# Patient Record
Sex: Female | Born: 2000 | Race: Black or African American | Hispanic: No | Marital: Single | Smoking: Never smoker
Health system: Southern US, Community
[De-identification: ages and names within clinical notes are randomized; demographics above are authoritative.]

## PROBLEM LIST (undated history)

## (undated) DIAGNOSIS — Z789 Other specified health status: Secondary | ICD-10-CM

## (undated) DIAGNOSIS — N83209 Unspecified ovarian cyst, unspecified side: Secondary | ICD-10-CM

## (undated) HISTORY — PX: OTHER SURGICAL HISTORY: SHX169

## (undated) HISTORY — DX: Other specified health status: Z78.9

---
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
## 2021-02-12 ENCOUNTER — Encounter: Payer: Self-pay | Admitting: Certified Nurse Midwife

## 2021-02-12 ENCOUNTER — Other Ambulatory Visit: Payer: Self-pay

## 2021-02-12 ENCOUNTER — Ambulatory Visit (INDEPENDENT_AMBULATORY_CARE_PROVIDER_SITE_OTHER): Payer: Self-pay | Admitting: Certified Nurse Midwife

## 2021-02-12 VITALS — BP 104/70 | HR 96 | Resp 16 | Ht 66.0 in | Wt 103.9 lb

## 2021-02-12 DIAGNOSIS — N926 Irregular menstruation, unspecified: Secondary | ICD-10-CM

## 2021-02-12 LAB — POCT URINE PREGNANCY: Preg Test, Ur: NEGATIVE

## 2021-02-12 MED ORDER — ORTHO-NOVUM 1/35 (28) 1-35 MG-MCG PO TABS: 1.0000 | ORAL_TABLET | Freq: Every day | ORAL | 11 refills | Status: DC

## 2021-02-12 NOTE — Progress Notes (Signed)
GYN ENCOUNTER NOTE  Subjective:       Virginia Ochoa is a 20 y.o. G0P0000 female is here for gynecologic evaluation of the following issues:  1. Having irregularities with current OCP, She states she has missed some cycles. She denies missing any of the pills.     Gynecologic History Patient's last menstrual period was 11/19/2020 (exact date). Contraception: OCP (estrogen/progesterone) Last Pap: n/a  Last mammogram: n/a   Obstetric History OB History  Gravida Para Term Preterm AB Living  0 0 0 0 0 0  SAB IAB Ectopic Multiple Live Births  0 0 0 0 0    Past Medical History:  Diagnosis Date  . Known health problems: none 01/05/2019  . Ovarian cyst     Past Surgical History:  Procedure Laterality Date  . none      Current Outpatient Medications on File Prior to Visit  Medication Sig Dispense Refill  . ferrous sulfate 325 (65 FE) MG EC tablet Take 325 mg by mouth 3 (three) times daily with meals.    . vitamin C (ASCORBIC ACID) 500 MG tablet Take 500 mg by mouth daily.     No current facility-administered medications on file prior to visit.    No Known Allergies  Social History   Socioeconomic History  . Marital status: Single    Spouse name: Not on file  . Number of children: Not on file  . Years of education: Not on file  . Highest education level: Not on file  Occupational History  . Not on file  Tobacco Use  . Smoking status: Never Smoker  . Smokeless tobacco: Never Used  Vaping Use  . Vaping Use: Never used  Substance and Sexual Activity  . Alcohol use: Never  . Drug use: Never  . Sexual activity: Never    Birth control/protection: I.U.D.  Other Topics Concern  . Not on file  Social History Narrative  . Not on file   Social Determinants of Health   Financial Resource Strain: Not on file  Food Insecurity: Not on file  Transportation Needs: Not on file  Physical Activity: Not on file  Stress: Not on file  Social Connections: Not on file  Intimate  Partner Violence: Not on file    Family History  Adopted: Yes    The following portions of the patient's history were reviewed and updated as appropriate: allergies, current medications, past family history, past medical history, past social history, past surgical history and problem list.  Review of Systems Review of Systems - Negative except as mentieond in HPI Review of Systems - General ROS: negative for - chills, fatigue, fever, hot flashes, malaise or night sweats Hematological and Lymphatic ROS: negative for - bleeding problems or swollen lymph nodes Gastrointestinal ROS: negative for - abdominal pain, blood in stools, change in bowel habits and nausea/vomiting Musculoskeletal ROS: negative for - joint pain, muscle pain or muscular weakness Genito-Urinary ROS: negative for - change in menstrual cycle, dysmenorrhea, dyspareunia, dysuria, genital discharge, genital ulcers, hematuria, incontinence,heavy menses, nocturia or pelvic pain. Positive for irregular cycle on new pill   Objective:   BP 104/70   Pulse 96   Resp 16   Ht 5\' 6"  (1.676 m)   Wt 103 lb 14.4 oz (47.1 kg)   LMP 11/19/2020 (Exact Date)   BMI 16.77 kg/m  CONSTITUTIONAL: Well-developed, well-nourished female in no acute distress.  HENT:  Normocephalic, atraumatic.  NECK: Normal range of motion, supple, no masses.  Normal thyroid.  SKIN: Skin is warm and dry. No rash noted. Not diaphoretic. No erythema. No pallor. NEUROLGIC: Alert and oriented to person, place, and time. PSYCHIATRIC: Normal mood and affect. Normal behavior. Normal judgment and thought content. CARDIOVASCULAR:Not Examined RESPIRATORY: Not Examined BREASTS: Not Examined ABDOMEN: Soft, non distended; Non tender.  No Organomegaly. PELVIC:not indicated  MUSCULOSKELETAL: Normal range of motion. No tenderness.  No cyanosis, clubbing, or edema.     Assessment:   1. Irregular periods      Plan:  Discussed cycle and likely cause due to low  estrogen in current pill. Reassurance given. She is happy with taking OCP . Suggesting changing to higher dose. She verbalizes and agree to plan. Orders placed. Instructed pt to give in a few months on new pill to allow her body to adjust and her cycle to regulate. She verbalize and agree. Follow up prn.   Doreene Burke, CNM

## 2021-03-05 ENCOUNTER — Emergency Department
Admission: EM | Admit: 2021-03-05 | Discharge: 2021-03-05 | Disposition: A | Discharge status: Home / Self Care | Payer: BC Managed Care – PPO | Attending: Student in an Organized Health Care Education/Training Program | Admitting: Student in an Organized Health Care Education/Training Program

## 2021-03-05 ENCOUNTER — Encounter: Payer: Self-pay | Admitting: Emergency Medicine

## 2021-03-05 ENCOUNTER — Other Ambulatory Visit: Payer: Self-pay

## 2021-03-05 DIAGNOSIS — E860 Dehydration: Secondary | ICD-10-CM | POA: Insufficient documentation

## 2021-03-05 DIAGNOSIS — R031 Nonspecific low blood-pressure reading: Secondary | ICD-10-CM | POA: Insufficient documentation

## 2021-03-05 DIAGNOSIS — R55 Syncope and collapse: Secondary | ICD-10-CM | POA: Insufficient documentation

## 2021-03-05 LAB — BASIC METABOLIC PANEL
Anion gap: 7 (ref 5–15)
BUN: 11 mg/dL (ref 6–20)
CO2: 22 mmol/L (ref 22–32)
Calcium: 8.6 mg/dL — ABNORMAL LOW (ref 8.9–10.3)
Chloride: 106 mmol/L (ref 98–111)
Creatinine, Ser: 0.75 mg/dL (ref 0.44–1.00)
GFR, Estimated: >60 mL/min (ref >60)
Glucose, Bld: 115 mg/dL — ABNORMAL HIGH (ref 70–99)
Potassium: 3.8 mmol/L (ref 3.5–5.1)
Sodium: 135 mmol/L (ref 135–145)

## 2021-03-05 LAB — URINALYSIS, COMPLETE (UACMP) WITH MICROSCOPIC
Bilirubin Urine: NEGATIVE
Glucose, UA: NEGATIVE mg/dL
Hgb urine dipstick: NEGATIVE
Ketones, ur: 20 mg/dL — AB
Leukocytes,Ua: NEGATIVE
Nitrite: NEGATIVE
Protein, ur: NEGATIVE mg/dL
Specific Gravity, Urine: 1.023 (ref 1.005–1.030)
pH: 5 (ref 5.0–8.0)

## 2021-03-05 LAB — CBC
HCT: 36.6 % (ref 36.0–46.0)
Hemoglobin: 12.6 g/dL (ref 12.0–15.0)
MCH: 30.8 pg (ref 26.0–34.0)
MCHC: 34.4 g/dL (ref 30.0–36.0)
MCV: 89.5 fL (ref 80.0–100.0)
Platelets: 279 10*3/uL (ref 150–400)
RBC: 4.09 MIL/uL (ref 3.87–5.11)
RDW: 11.4 % — ABNORMAL LOW (ref 11.5–15.5)
WBC: 11.3 10*3/uL — ABNORMAL HIGH (ref 4.0–10.5)
nRBC: 0 % (ref 0.0–0.2)

## 2021-03-05 LAB — POC URINE PREG, ED: Preg Test, Ur: NEGATIVE

## 2021-03-05 MED ORDER — SODIUM CHLORIDE 0.9 % IV BOLUS
1000.0000 mL | Freq: Once | INTRAVENOUS | Status: AC
  Administered 2021-03-05: 1000 mL via INTRAVENOUS

## 2021-03-05 NOTE — ED Notes (Signed)
Pt attempting urine sample 

## 2021-03-05 NOTE — ED Triage Notes (Signed)
Pt comes into the ED via EMS from , states she was walking with her boyfriend and had a syncopal episode, pt is a/ox3 on arrival, denies any injuries states her boyfriend caught her and carried her back to the house   #20LFA 98/64 [id number] 115-100HR 99%RA

## 2021-03-05 NOTE — ED Triage Notes (Signed)
Took a walk outside after taking a nap and felt dizzy and fell onto gravel driveway.  Denies c/o pain.  C/o generalized fatigue.

## 2021-03-05 NOTE — ED Provider Notes (Signed)
[facility] Emergency Department Provider Note    Event Date/Time   First MD Initiated Contact with Patient 03/05/21 1620     (approximate)  I have reviewed the triage vital signs and the nursing notes.   HISTORY  Chief Complaint Loss of Consciousness    HPI Virginia Ochoa is a 20 y.o. female with no significant past medical history presents to the ER for evaluation of near syncopal event that occurred while she was outside with her boyfriend and had a near fainting spell.  No seizure-like activity.  No loss of urinary continence.  No bite her tongue.  Denies any headache.  Denies any chest pain or shortness of breath.  States that she did skip breakfast this morning.  No new medications.  Denies any abdominal pain.  No dysuria.  No diarrhea.    Past Medical History:  Diagnosis Date  . Known health problems: none 01/05/2019  . Ovarian cyst    Family History  Adopted: Yes   Past Surgical History:  Procedure Laterality Date  . none     Patient Active Problem List   Diagnosis Date Noted  . Ovarian cyst 10/19/2019  . Menorrhagia 01/05/2019      Prior to Admission medications   Medication Sig Start Date End Date Taking? Authorizing Provider  ferrous sulfate 325 (65 FE) MG EC tablet Take 325 mg by mouth 3 (three) times daily with meals.    [provider]  norethindrone-ethinyl estradiol 1/35 (ORTHO-NOVUM 1/35, 28,) tablet Take 1 tablet by mouth daily. 02/12/21   Doreene Burke, CNM  vitamin C (ASCORBIC ACID) 500 MG tablet Take 500 mg by mouth daily.    [provider]    Allergies Patient has no known allergies.    Social History Social History   Tobacco Use  . Smoking status: Never Smoker  . Smokeless tobacco: Never Used  Vaping Use  . Vaping Use: Never used  Substance Use Topics  . Alcohol use: Never  . Drug use: Never    Review of Systems Patient denies headaches, rhinorrhea, blurry vision, numbness, shortness  of breath, chest pain, edema, cough, abdominal pain, nausea, vomiting, diarrhea, dysuria, fevers, rashes or hallucinations unless otherwise stated above in HPI. ____________________________________________   PHYSICAL EXAM:  VITAL SIGNS: Vitals:   03/05/21 1505 03/05/21 1700  BP: (!) 91/56 113/71  Pulse: 89 73  Resp: 16 16  Temp: 97.8 F (36.6 C)   SpO2: 98% 99%    Constitutional: Alert and oriented.  Eyes: Conjunctivae are normal.  Head: Atraumatic. Nose: No congestion/rhinnorhea. Mouth/Throat: Mucous membranes are moist.   Neck: No stridor. Painless ROM.  Cardiovascular: Normal rate, regular rhythm. Grossly normal heart sounds.  Good peripheral circulation. Respiratory: Normal respiratory effort.  No retractions. Lungs CTAB. Gastrointestinal: Soft and nontender. No distention. No abdominal bruits. No CVA tenderness. Genitourinary:  Musculoskeletal: No lower extremity tenderness nor edema.  No joint effusions. Neurologic:  CN- intact.  No facial droop, Normal FNF.  Normal heel to shin.  Sensation intact bilaterally. Normal speech and language. No gross focal neurologic deficits are appreciated. No gait instability. Skin:  Skin is warm, dry and intact. No rash noted. Psychiatric: Mood and affect are normal. Speech and behavior are normal.  ____________________________________________   LABS (all labs ordered are listed, but only abnormal results are displayed)  Results for orders placed or performed during the hospital encounter of 03/05/21 (from the past 24 hour(s))  Basic metabolic panel     Status: Abnormal  Collection Time: 03/05/21  3:10 PM  Result Value Ref Range   Sodium 135 135 - 145 mmol/L   Potassium 3.8 3.5 - 5.1 mmol/L   Chloride 106 98 - 111 mmol/L   CO2 22 22 - 32 mmol/L   Glucose, Bld 115 (H) 70 - 99 mg/dL   BUN 11 6 - 20 mg/dL   Creatinine, Ser 1.61 0.44 - 1.00 mg/dL   Calcium 8.6 (L) 8.9 - 10.3 mg/dL   GFR, Estimated >09 >60 mL/min   Anion gap 7 5 -  15  CBC     Status: Abnormal   Collection Time: 03/05/21  3:10 PM  Result Value Ref Range   WBC 11.3 (H) 4.0 - 10.5 K/uL   RBC 4.09 3.87 - 5.11 MIL/uL   Hemoglobin 12.6 12.0 - 15.0 g/dL   HCT 45.4 09.8 - 11.9 %   MCV 89.5 80.0 - 100.0 fL   MCH 30.8 26.0 - 34.0 pg   MCHC 34.4 30.0 - 36.0 g/dL   RDW 14.7 (L) 82.9 - 56.2 %   Platelets 279 150 - 400 K/uL   nRBC 0.0 0.0 - 0.2 %  Urinalysis, Complete w Microscopic     Status: Abnormal   Collection Time: 03/05/21  3:10 PM  Result Value Ref Range   Color, Urine YELLOW (A) YELLOW   APPearance HAZY (A) CLEAR   Specific Gravity, Urine 1.023 1.005 - 1.030   pH 5.0 5.0 - 8.0   Glucose, UA NEGATIVE NEGATIVE mg/dL   Hgb urine dipstick NEGATIVE NEGATIVE   Bilirubin Urine NEGATIVE NEGATIVE   Ketones, ur 20 (A) NEGATIVE mg/dL   Protein, ur NEGATIVE NEGATIVE mg/dL   Nitrite NEGATIVE NEGATIVE   Leukocytes,Ua NEGATIVE NEGATIVE   RBC / HPF 0-5 0 - 5 RBC/hpf   WBC, UA 11-20 0 - 5 WBC/hpf   Bacteria, UA RARE (A) NONE SEEN   Squamous Epithelial / LPF 0-5 0 - 5   Mucus PRESENT   POC urine preg, ED     Status: None   Collection Time: 03/05/21  6:19 PM  Result Value Ref Range   Preg Test, Ur NEGATIVE NEGATIVE   ____________________________________________  EKG My review and personal interpretation at Time: 15:14   Indication: near syncope  Rate: 90  Rhythm: sinus Axis: normal Other: normal intervals, no stemi ____________________________________________  RADIOLOGY  I personally reviewed all radiographic images ordered to evaluate for the above acute complaints and reviewed radiology reports and findings.  These findings were personally discussed with the patient.  Please see medical record for radiology report.  ____________________________________________   PROCEDURES  Procedure(s) performed:  Procedures    Critical Care performed: no ____________________________________________   INITIAL IMPRESSION / ASSESSMENT AND PLAN / ED  COURSE  Pertinent labs & imaging results that were available during my care of the patient were reviewed by me and considered in my medical decision making (see chart for details).   DDX: Dehydration, orthostasis, heat syncope, electrolyte abnormality, anemia, seizure, dysrhythmia  Virginia Ochoa is a 20 y.o. who presents to the ED with presentation as described above.  Patient nontoxic-appearing.  Slightly low blood pressure on arrival therefore given IV fluids.  Do suspect that she is dehydrated may have had some orthostasis therefore will give IV fluids check blood work and reassess.  Her exam is reassuring.  There is no head injury.  No indication for imaging at this time.    Clinical Course as of 03/05/21 1853  Mon Mar 05, 2021  [id number] Patient feels significantly improved after IV fluids.  She is well-appearing.  Tolerating p.o. appears nonpregnant.  EKG without any signs of preexcitation syndrome.  She is able to ambulate down the hall and feels like he is back to baseline at this point he wishes stable and appropriate for outpatient follow-up.  Have discussed with the patient and available family all diagnostics and treatments performed thus far and all questions were answered to the best of my ability. The patient demonstrates understanding and agreement with plan.    [PR]    Clinical Course User Index [PR] Willy Eddy, MD    The patient was evaluated in Emergency Department today for the symptoms described in the history of present illness. He/she was evaluated in the context of the global COVID-19 pandemic, which necessitated consideration that the patient might be at risk for infection with the SARS-CoV-2 virus that causes COVID-19. Institutional protocols and algorithms that pertain to the evaluation of patients at risk for COVID-19 are in a state of rapid change based on information released by regulatory bodies including the CDC and federal and state organizations. These policies  and algorithms were followed during the patient's care in the ED.  As part of my medical decision making, I reviewed the following data within the electronic MEDICAL RECORD NUMBER Nursing notes reviewed and incorporated, Labs reviewed, notes from prior ED visits and [place] Controlled Substance Database   ____________________________________________   FINAL CLINICAL IMPRESSION(S) / ED DIAGNOSES  Final diagnoses:  Near syncope  Dehydration      NEW MEDICATIONS STARTED DURING THIS VISIT:  New Prescriptions   No medications on file     Note:  This document was prepared using Dragon voice recognition software and may include unintentional dictation errors.    Willy Eddy, MD 03/05/21 [id number]

## 2021-03-11 ENCOUNTER — Emergency Department: Payer: BC Managed Care – PPO

## 2021-03-11 ENCOUNTER — Emergency Department
Admission: EM | Admit: 2021-03-11 | Discharge: 2021-03-12 | Disposition: A | Discharge status: Home / Self Care | Payer: BC Managed Care – PPO | Attending: Emergency Medicine | Admitting: Emergency Medicine

## 2021-03-11 ENCOUNTER — Other Ambulatory Visit: Payer: Self-pay

## 2021-03-11 DIAGNOSIS — R079 Chest pain, unspecified: Secondary | ICD-10-CM | POA: Insufficient documentation

## 2021-03-11 DIAGNOSIS — R42 Dizziness and giddiness: Secondary | ICD-10-CM | POA: Insufficient documentation

## 2021-03-11 DIAGNOSIS — R0602 Shortness of breath: Secondary | ICD-10-CM

## 2021-03-11 LAB — CBC
HCT: 45.1 % (ref 36.0–46.0)
Hemoglobin: 15.7 g/dL — ABNORMAL HIGH (ref 12.0–15.0)
MCH: 31.1 pg (ref 26.0–34.0)
MCHC: 34.8 g/dL (ref 30.0–36.0)
MCV: 89.3 fL (ref 80.0–100.0)
Platelets: 326 10*3/uL (ref 150–400)
RBC: 5.05 MIL/uL (ref 3.87–5.11)
RDW: 11.4 % — ABNORMAL LOW (ref 11.5–15.5)
WBC: 5 10*3/uL (ref 4.0–10.5)
nRBC: 0 % (ref 0.0–0.2)

## 2021-03-11 LAB — BASIC METABOLIC PANEL
Anion gap: 9 (ref 5–15)
BUN: 13 mg/dL (ref 6–20)
CO2: 25 mmol/L (ref 22–32)
Calcium: 9.7 mg/dL (ref 8.9–10.3)
Chloride: 105 mmol/L (ref 98–111)
Creatinine, Ser: 0.81 mg/dL (ref 0.44–1.00)
GFR, Estimated: >60 mL/min (ref >60)
Glucose, Bld: 77 mg/dL (ref 70–99)
Potassium: 3.8 mmol/L (ref 3.5–5.1)
Sodium: 139 mmol/L (ref 135–145)

## 2021-03-11 LAB — D-DIMER, QUANTITATIVE: D-Dimer, Quant: <0.27 ug/mL-FEU (ref 0.00–0.50)

## 2021-03-11 LAB — TROPONIN I (HIGH SENSITIVITY)
Troponin I (High Sensitivity): <2 ng/L (ref <18)
Troponin I (High Sensitivity): <2 ng/L (ref <18)

## 2021-03-11 NOTE — ED Triage Notes (Signed)
Pt states she had a syncopal episode on Monday. Pt states prior to syncopal episode she became dizzy and shob. Pt states she followed up with her cardiologist and was supposed to get a monitor but has not had one yet. Pt complains of dizziness, shob, and chest pain tonight while in [employer]. Pt appears in no acute distress, able to speak in full sentences.

## 2021-03-11 NOTE — ED Provider Notes (Signed)
[facility] Emergency Department Provider Note  ____________________________________________  Time seen: Approximately 11:51 PM  I have reviewed the triage vital signs and the nursing notes.   HISTORY  Chief Complaint Shortness of Breath and Chest Pain   HPI Virginia Ochoa is a 20 y.o. female who presents for evaluation of dizziness and chest pain.  Patient reports that she was driving when she started feeling dizzy, and she developed sharp chest pain and shortness of breath.  This is patient's second episode  although the first 1 she also had a syncopal event.  The first episode happened 5 days ago.  She was seen in the emergency room and diagnosed with mild dehydration.  She has since followed up with cardiology and her primary care doctor.  She has been tested for Covid, mononucleosis, thyroid dysfunction and is on the process of getting a Holter monitor from cardiology.  She denies feeling under stress or anxiety, she denies feeling depressed.  She is accompanied by her adoptive father.  Unknown family history.  She does take birth control but denies leg pain or swelling, hemoptysis, recent travel or immobilization.  Patient denies ever having similar symptoms other than this week.  No headache, no blurry vision, no dysarthria, no diplopia, no dysphagia, no gait instability, no unilateral weakness or numbness.  According to her father who is at bedside patient seems to be more short of breath and more fatigued since having Covid several months ago.  Past Medical History:  Diagnosis Date  . Known health problems: none 01/05/2019  . Ovarian cyst     Patient Active Problem List   Diagnosis Date Noted  . Ovarian cyst 10/19/2019  . Menorrhagia 01/05/2019    Past Surgical History:  Procedure Laterality Date  . none      Prior to Admission medications   Medication Sig Start Date End Date Taking? Authorizing Provider  ferrous sulfate 325 (65 FE) MG EC tablet  Take 325 mg by mouth 3 (three) times daily with meals.    [provider]  norethindrone-ethinyl estradiol 1/35 (ORTHO-NOVUM 1/35, 28,) tablet Take 1 tablet by mouth daily. 02/12/21   Doreene Burke, CNM  vitamin C (ASCORBIC ACID) 500 MG tablet Take 500 mg by mouth daily.    [provider]    Allergies Patient has no known allergies.  Family History  Adopted: Yes    Social History Social History   Tobacco Use  . Smoking status: Never Smoker  . Smokeless tobacco: Never Used  Vaping Use  . Vaping Use: Never used  Substance Use Topics  . Alcohol use: Never  . Drug use: Never    Review of Systems  Constitutional: Negative for fever. + dizziness Eyes: Negative for visual changes. ENT: Negative for sore throat. Neck: No neck pain  Cardiovascular: + chest pain. Respiratory: + shortness of breath. Gastrointestinal: Negative for abdominal pain, vomiting or diarrhea. Genitourinary: Negative for dysuria. Musculoskeletal: Negative for back pain. Skin: Negative for rash. Neurological: Negative for headaches, weakness or numbness. Psych: No SI or HI  ____________________________________________   PHYSICAL EXAM:  VITAL SIGNS: ED Triage Vitals [03/11/21 1955]  Enc Vitals Group     BP 111/72     Pulse Rate 85     Resp 18     Temp 98 F (36.7 C)     Temp Source Oral     SpO2 100 %     Weight 104 lb (47.2 kg)     Height 5\' 6"  (  1.676 m)     Head Circumference      Peak Flow      Pain Score 8     Pain Loc      Pain Edu?      Excl. in GC?     Constitutional: Alert and oriented. Well appearing and in no apparent distress. HEENT:      Head: Normocephalic and atraumatic.         Eyes: Conjunctivae are normal. Sclera is non-icteric.       Mouth/Throat: Mucous membranes are moist.       Neck: Supple with no signs of meningismus. Cardiovascular: Regular rate and rhythm. No murmurs, gallops, or rubs. 2+ symmetrical distal pulses are present in all  extremities. No JVD. Respiratory: Normal respiratory effort. Lungs are clear to auscultation bilaterally.  Gastrointestinal: Soft, non tender, and non distended with positive bowel sounds. No rebound or guarding. Genitourinary: No CVA tenderness. Musculoskeletal:  No edema, cyanosis, or erythema of extremities. Neurologic: Normal speech and language. Face is symmetric. Moving all extremities. No gross focal neurologic deficits are appreciated. Skin: Skin is warm, dry and intact. No rash noted. Psychiatric: Mood and affect are normal. Speech and behavior are normal.  ____________________________________________   LABS (all labs ordered are listed, but only abnormal results are displayed)  Labs Reviewed  CBC - Abnormal; Notable for the following components:      Result Value   Hemoglobin 15.7 (*)    RDW 11.4 (*)    All other components within normal limits  BASIC METABOLIC PANEL  D-DIMER, QUANTITATIVE  HCG, QUANTITATIVE, PREGNANCY  POC URINE PREG, ED  TROPONIN I (HIGH SENSITIVITY)  TROPONIN I (HIGH SENSITIVITY)   ____________________________________________  EKG  ED ECG REPORT I, Nita Sickle, the attending physician, personally viewed and interpreted this ECG.  Normal sinus rhythm, rate 88, normal intervals, no STE or depression. Unchanged from prior from 5 days ago ____________________________________________  RADIOLOGY  I have personally reviewed the images performed during this visit and I agree with the Radiologist's read.   Interpretation by Radiologist:  DG Chest 2 View  Result Date: 03/11/2021 CLINICAL DATA:  Chest pain short of breath EXAM: CHEST - 2 VIEW COMPARISON:  01/24/2011 FINDINGS: The heart size and mediastinal contours are within normal limits. Both lungs are clear. Mild scoliosis. IMPRESSION: No active cardiopulmonary disease. Electronically Signed   By: Jasmine Pang M.D.   On: 03/11/2021 20:40      ____________________________________________   PROCEDURES  Procedure(s) performed:yes .1-3 Lead EKG Interpretation Performed by: Nita Sickle, MD Authorized by: Nita Sickle, MD     Interpretation: normal     ECG rate assessment: normal     Rhythm: sinus rhythm     Ectopy: none     Conduction: normal     Critical Care performed:  None ____________________________________________   INITIAL IMPRESSION / ASSESSMENT AND PLAN / ED COURSE  20 y.o. female who presents for evaluation of dizziness and chest pain.  This is patient's second visit to the emergency room for similar symptoms.  Has been evaluated here, by her PCP, and currently being evaluated by cardiology for Holter monitor.  Will place patient on telemetry for monitoring of any dysrhythmias.  Orthostatic vital signs negative.  Patient is otherwise neurologically intact, heart regular rate and rhythm with no murmurs, no abdominal tenderness, pulses are equal and strong in all 4 extremities. No HA to indicated intracranial pathology such as aneurysm or mass.  EKG with no signs of dysrhythmias or  ischemia and unchanged from prior from 5 days ago.  No signs of pericarditis. Vital signs are within normal limits.  Labs with no electrolyte derangements, no signs of dehydration, no leukocytosis.  Troponin x2 - therefore less likely myocarditis. D-dimer neg with no tachypnea, tachycardia, or hypoxia therefore less likely PE.  Pregnancy test is negative.  Patient has had thyroid studies done by PCP which were all negative.  Patient was tested for Covid by PCP which was negative per report. Old medical records reviewed.    _________________________ 12:48 AM on 03/12/2021 -----------------------------------------  Patient monitored on telemetry for 1 hour with no signs of dysrhythmias.  Remains well-appearing and in no distress.  Work-up here essentially again unremarkable.  Recommended following up with her cardiologist for the  Holter monitoring and calling her primary care doctor to discuss having another episode that brought her to the ER.  I did recommend patient try to avoid driving in case she has another episode behind the wheel.  These recommendations were discussed with her and her father who was at bedside.  Discussed my standard return precautions.    _____________________________________________ Please note:  Patient was evaluated in Emergency Department today for the symptoms described in the history of present illness. Patient was evaluated in the context of the global COVID-19 pandemic, which necessitated consideration that the patient might be at risk for infection with the SARS-CoV-2 virus that causes COVID-19. Institutional protocols and algorithms that pertain to the evaluation of patients at risk for COVID-19 are in a state of rapid change based on information released by regulatory bodies including the CDC and federal and state organizations. These policies and algorithms were followed during the patient's care in the ED.  Some ED evaluations and interventions may be delayed as a result of limited staffing during the pandemic.   [place] Controlled Substance Database was reviewed by me. ____________________________________________   FINAL CLINICAL IMPRESSION(S) / ED DIAGNOSES   Final diagnoses:  Dizziness  Chest pain, unspecified type  Shortness of breath      NEW MEDICATIONS STARTED DURING THIS VISIT:  ED Discharge Orders    None       Note:  This document was prepared using Dragon voice recognition software and may include unintentional dictation errors.    Don Perking, [place], MD 03/12/21 [phone]

## 2021-03-11 NOTE — ED Notes (Signed)
Pt placed on cardiac monitor at this time.

## 2021-03-12 LAB — HCG, QUANTITATIVE, PREGNANCY: hCG, Beta Chain, Quant, S: <1 m[IU]/mL (ref <5)

## 2021-03-12 NOTE — Discharge Instructions (Signed)
As we discussed, make sure to follow up with cardiology for the holter monitoring.  Also make sure to let your primary care doctor know that you had another episode.  In the meantime try to avoid driving in case have an episode like this why you are behind the wheel to avoid an accident.  If you are unable to avoid driving I recommend that you try at least to stay away from high-speed highways.

## 2021-03-13 ENCOUNTER — Emergency Department
Admission: EM | Admit: 2021-03-13 | Discharge: 2021-03-13 | Disposition: A | Discharge status: Home / Self Care | Payer: BC Managed Care – PPO | Attending: Emergency Medicine | Admitting: Emergency Medicine

## 2021-03-13 ENCOUNTER — Encounter: Payer: Self-pay | Admitting: Emergency Medicine

## 2021-03-13 ENCOUNTER — Emergency Department: Payer: BC Managed Care – PPO

## 2021-03-13 ENCOUNTER — Other Ambulatory Visit: Payer: Self-pay

## 2021-03-13 DIAGNOSIS — R002 Palpitations: Secondary | ICD-10-CM

## 2021-03-13 DIAGNOSIS — R079 Chest pain, unspecified: Secondary | ICD-10-CM | POA: Diagnosis not present

## 2021-03-13 DIAGNOSIS — R0602 Shortness of breath: Secondary | ICD-10-CM | POA: Diagnosis not present

## 2021-03-13 LAB — CBC
HCT: 39.5 % (ref 36.0–46.0)
Hemoglobin: 13.7 g/dL (ref 12.0–15.0)
MCH: 30.6 pg (ref 26.0–34.0)
MCHC: 34.7 g/dL (ref 30.0–36.0)
MCV: 88.4 fL (ref 80.0–100.0)
Platelets: 308 10*3/uL (ref 150–400)
RBC: 4.47 MIL/uL (ref 3.87–5.11)
RDW: 11.3 % — ABNORMAL LOW (ref 11.5–15.5)
WBC: 5 10*3/uL (ref 4.0–10.5)
nRBC: 0 % (ref 0.0–0.2)

## 2021-03-13 LAB — BASIC METABOLIC PANEL
Anion gap: 7 (ref 5–15)
BUN: 14 mg/dL (ref 6–20)
CO2: 24 mmol/L (ref 22–32)
Calcium: 9.6 mg/dL (ref 8.9–10.3)
Chloride: 105 mmol/L (ref 98–111)
Creatinine, Ser: 0.73 mg/dL (ref 0.44–1.00)
GFR, Estimated: >60 mL/min (ref >60)
Glucose, Bld: 93 mg/dL (ref 70–99)
Potassium: 3.9 mmol/L (ref 3.5–5.1)
Sodium: 136 mmol/L (ref 135–145)

## 2021-03-13 LAB — POC URINE PREG, ED: Preg Test, Ur: NEGATIVE

## 2021-03-13 LAB — TROPONIN I (HIGH SENSITIVITY): Troponin I (High Sensitivity): <2 ng/L (ref <18)

## 2021-03-13 MED ORDER — IOHEXOL 350 MG/ML SOLN
75.0000 mL | Freq: Once | INTRAVENOUS | Status: AC | PRN
  Administered 2021-03-13: 75 mL via INTRAVENOUS
  Filled 2021-03-13: qty 75

## 2021-03-13 NOTE — ED Triage Notes (Signed)
Pt c/o central chest pain intermittently over the last week. Pt has been seen in ED x2 for same and followed up with cardiology but was unable to get heart monitor in place today due to office issue. Pt to ED tonight due to continued episodes of chest pain.

## 2021-03-13 NOTE — ED Notes (Signed)
Patient transported to CT 

## 2021-03-13 NOTE — ED Provider Notes (Signed)
[facility] Emergency Department Provider Note  ____________________________________________   Event Date/Time   First MD Initiated Contact with Patient 03/13/21 1911     (approximate)  I have reviewed the triage vital signs and the nursing notes.   HISTORY  Chief Complaint Chest Pain    HPI Virginia Ochoa is a 20 y.o. female with central chest pain who comes in for evaluation.  This is patient's third time coming in for this.  Patient states that she was seen by her cardiologist and then try to get a Holter monitor to evaluate her for SVT but unfortunately they did not have a monitor today to put on her.  She states that today she had the pain prior to arrival that was constant, severe, so she with some palpitations, nothing made it better, nothing made it worse.  No abdominal pain.  The pain went away after a few minutes.  She did have some associate shortness of breath with that.  She has already had her thyroid tested negative D-dimer but was reportedly told to come to the ER to have a CT scan to rule out PE.  Her only risk factor for PE is she is on birth control  Patient is symptom-free at this time          Past Medical History:  Diagnosis Date  . Known health problems: none 01/05/2019  . Ovarian cyst     Patient Active Problem List   Diagnosis Date Noted  . Ovarian cyst 10/19/2019  . Menorrhagia 01/05/2019    Past Surgical History:  Procedure Laterality Date  . none      Prior to Admission medications   Medication Sig Start Date End Date Taking? Authorizing Provider  ferrous sulfate 325 (65 FE) MG EC tablet Take 325 mg by mouth 3 (three) times daily with meals.    [provider]  norethindrone-ethinyl estradiol 1/35 (ORTHO-NOVUM 1/35, 28,) tablet Take 1 tablet by mouth daily. 02/12/21   Doreene Burke, CNM  vitamin C (ASCORBIC ACID) 500 MG tablet Take 500 mg by mouth daily.    [provider]    Allergies Patient  has no known allergies.  Family History  Adopted: Yes    Social History Social History   Tobacco Use  . Smoking status: Never Smoker  . Smokeless tobacco: Never Used  Vaping Use  . Vaping Use: Never used  Substance Use Topics  . Alcohol use: Never  . Drug use: Never      Review of Systems Constitutional: No fever/chills Eyes: No visual changes. ENT: No sore throat. Cardiovascular: Positive chest pain, palpitation Respiratory: Denies shortness of breath. Gastrointestinal: No abdominal pain.  No nausea, no vomiting.  No diarrhea.  No constipation. Genitourinary: Negative for dysuria. Musculoskeletal: Negative for back pain. Skin: Negative for rash. Neurological: Negative for headaches, focal weakness or numbness. All other ROS negative ____________________________________________   PHYSICAL EXAM:  VITAL SIGNS: ED Triage Vitals [03/13/21 1841]  Enc Vitals Group     BP 106/69     Pulse Rate 97     Resp 15     Temp 98 F (36.7 C)     Temp Source Oral     SpO2 100 %     Weight      Height      Head Circumference      Peak Flow      Pain Score      Pain Loc      Pain Edu?  Excl. in GC?     Constitutional: Alert and oriented. Well appearing and in no acute distress. Eyes: Conjunctivae are normal. EOMI. Head: Atraumatic. Nose: No congestion/rhinnorhea. Mouth/Throat: Mucous membranes are moist.   Neck: No stridor. Trachea Midline. FROM Cardiovascular: Normal rate, regular rhythm. Grossly normal heart sounds.  Good peripheral circulation. Respiratory: Normal respiratory effort.  No retractions. Lungs CTAB. Gastrointestinal: Soft and nontender. No distention. No abdominal bruits.  Musculoskeletal: No lower extremity tenderness nor edema.  No joint effusions. Neurologic:  Normal speech and language. No gross focal neurologic deficits are appreciated.  Skin:  Skin is warm, dry and intact. No rash noted. Psychiatric: Mood and affect are normal. Speech and  behavior are normal. GU: Deferred   ____________________________________________   LABS (all labs ordered are listed, but only abnormal results are displayed)  Labs Reviewed  CBC - Abnormal; Notable for the following components:      Result Value   RDW 11.3 (*)    All other components within normal limits  BASIC METABOLIC PANEL  POC URINE PREG, ED  TROPONIN I (HIGH SENSITIVITY)   ____________________________________________   ED ECG REPORT I, Concha Se, the attending physician, personally viewed and interpreted this ECG.  Normal sinus rate of 87, no ST elevation, no T wave inversions, a lot of artifact but normal intervals grossly ____________________________________________  RADIOLOGY   Official radiology report(s): CT Angio Chest PE W and/or Wo Contrast  Result Date: 03/13/2021 CLINICAL DATA:  PE suspected, high prob Chest pain. EXAM: CT ANGIOGRAPHY CHEST WITH CONTRAST TECHNIQUE: Multidetector CT imaging of the chest was performed using the standard protocol during bolus administration of intravenous contrast. Multiplanar CT image reconstructions and MIPs were obtained to evaluate the vascular anatomy. CONTRAST:  55mL OMNIPAQUE IOHEXOL 350 MG/ML SOLN COMPARISON:  Radiograph 03/11/2021 FINDINGS: Cardiovascular: There are no filling defects within the pulmonary arteries to suggest pulmonary embolus. The thoracic aorta is normal in caliber. Conventional branching pattern from the aortic arch. Normal heart size. No pericardial effusion. Mediastinum/Nodes: No mediastinal or hilar adenopathy. Minimal residual thymus in the anterior mediastinum, normal for age. No esophageal wall thickening. No thyroid nodule. Lungs/Pleura: Clear lungs. No focal airspace disease. No pleural fluid. Trachea and central bronchi are patent. Upper Abdomen: No acute or unexpected findings. Musculoskeletal: No chest wall abnormality. No acute or suspicious osseous findings. Review of the MIP images confirms  the above findings. IMPRESSION: No pulmonary embolus or acute intrathoracic abnormality. No explanation for chest pain. Electronically Signed   By: Narda Rutherford M.D.   On: 03/13/2021 20:33    ____________________________________________   PROCEDURES  Procedure(s) performed (including Critical Care):  Procedures   ____________________________________________   INITIAL IMPRESSION / ASSESSMENT AND PLAN / ED COURSE   Virginia Ochoa was evaluated in Emergency Department on 03/13/2021 for the symptoms described in the history of present illness. She was evaluated in the context of the global COVID-19 pandemic, which necessitated consideration that the patient might be at risk for infection with the SARS-CoV-2 virus that causes COVID-19. Institutional protocols and algorithms that pertain to the evaluation of patients at risk for COVID-19 are in a state of rapid change based on information released by regulatory bodies including the CDC and federal and state organizations. These policies and algorithms were followed during the patient's care in the ED.    Most Likely DDx:  -MSK (atypical chest pain) but will get cardiac markers to evaluate for ACS given risk factors/age -Had a benefit risk discussion about CT PE given negative  D-dimer and the risk of breast cancer but family would like to proceed with CT PE to rule out pulmonary embolism -No abdominal pain to suggest this being gallbladder pathology.  Her chest pains all up higher.   DDx that was also considered d/t potential to cause harm, but was found less likely based on history and physical (as detailed above): -PNA (no fevers, cough but CXR to evaluate) -PNX (reassured with equal b/l breath sounds, CXR to evaluate) -Symptomatic anemia (will get H&H) -Aortic Dissection as no tearing pain and no radiation to the mid back, pulses equal -Pericarditis no rub on exam, EKG changes or hx to suggest dx -Tamponade (no notable SOB,  tachycardic, hypotensive) -Esophageal rupture (no h/o diffuse vomitting/no crepitus)   Labs are reassuring, CT scan is negative.  Patient already has follow-up to get echo and Holter monitor.  Patient be discharged home at this time.  Patient asymptomatic with normal vital signs  I discussed the provisional nature of ED diagnosis, the treatment so far, the ongoing plan of care, follow up appointments and return precautions with the patient and any family or support people present. They expressed understanding and agreed with the plan, discharged home.          ____________________________________________   FINAL CLINICAL IMPRESSION(S) / ED DIAGNOSES   Final diagnoses:  Palpitations  Chest pain, unspecified type     MEDICATIONS GIVEN DURING THIS VISIT:  Medications  iohexol (OMNIPAQUE) 350 MG/ML injection 75 mL (75 mLs Intravenous Contrast Given 03/13/21 2019)     ED Discharge Orders    None       Note:  This document was prepared using Dragon voice recognition software and may include unintentional dictation errors.   Concha Se, MD 03/13/21 2044

## 2021-03-13 NOTE — Discharge Instructions (Addendum)
CT is negative.  Follow up with cardiology.   Return to ER for continued symptoms or any other concerns.

## 2021-07-04 ENCOUNTER — Other Ambulatory Visit: Payer: Self-pay | Admitting: Nurse Practitioner

## 2021-07-04 DIAGNOSIS — N921 Excessive and frequent menstruation with irregular cycle: Secondary | ICD-10-CM

## 2021-07-27 ENCOUNTER — Ambulatory Visit
Admission: RE | Admit: 2021-07-27 | Discharge: 2021-07-27 | Disposition: A | Discharge status: Home / Self Care | Payer: BC Managed Care – PPO | Source: Ambulatory Visit | Attending: Nurse Practitioner | Admitting: Nurse Practitioner

## 2021-07-27 ENCOUNTER — Other Ambulatory Visit: Payer: Self-pay

## 2021-07-27 DIAGNOSIS — N921 Excessive and frequent menstruation with irregular cycle: Secondary | ICD-10-CM | POA: Diagnosis present

## 2021-09-11 ENCOUNTER — Emergency Department
Admission: EM | Admit: 2021-09-11 | Discharge: 2021-09-11 | Disposition: A | Discharge status: Home / Self Care | Payer: BC Managed Care – PPO | Attending: Emergency Medicine | Admitting: Emergency Medicine

## 2021-09-11 DIAGNOSIS — R002 Palpitations: Secondary | ICD-10-CM | POA: Insufficient documentation

## 2021-09-11 DIAGNOSIS — R000 Tachycardia, unspecified: Secondary | ICD-10-CM

## 2021-09-11 DIAGNOSIS — R072 Precordial pain: Secondary | ICD-10-CM | POA: Diagnosis not present

## 2021-09-11 LAB — CBC WITH DIFFERENTIAL/PLATELET
Abs Immature Granulocytes: 0.01 10*3/uL (ref 0.00–0.07)
Basophils Absolute: 0 10*3/uL (ref 0.0–0.1)
Basophils Relative: 0 %
Eosinophils Absolute: 0 10*3/uL (ref 0.0–0.5)
Eosinophils Relative: 0 %
HCT: 37.7 % (ref 36.0–46.0)
Hemoglobin: 13.4 g/dL (ref 12.0–15.0)
Immature Granulocytes: 0 %
Lymphocytes Relative: 27 %
Lymphs Abs: 1.4 10*3/uL (ref 0.7–4.0)
MCH: 32 pg (ref 26.0–34.0)
MCHC: 35.5 g/dL (ref 30.0–36.0)
MCV: 90 fL (ref 80.0–100.0)
Monocytes Absolute: 0.5 10*3/uL (ref 0.1–1.0)
Monocytes Relative: 9 %
Neutro Abs: 3.2 10*3/uL (ref 1.7–7.7)
Neutrophils Relative %: 64 %
Platelets: 234 10*3/uL (ref 150–400)
RBC: 4.19 MIL/uL (ref 3.87–5.11)
RDW: 11.5 % (ref 11.5–15.5)
WBC: 5.1 10*3/uL (ref 4.0–10.5)
nRBC: 0.4 % — ABNORMAL HIGH (ref 0.0–0.2)

## 2021-09-11 LAB — BASIC METABOLIC PANEL
Anion gap: 8 (ref 5–15)
BUN: 13 mg/dL (ref 6–20)
CO2: 24 mmol/L (ref 22–32)
Calcium: 9.1 mg/dL (ref 8.9–10.3)
Chloride: 105 mmol/L (ref 98–111)
Creatinine, Ser: 0.72 mg/dL (ref 0.44–1.00)
GFR, Estimated: >60 mL/min (ref >60)
Glucose, Bld: 68 mg/dL — ABNORMAL LOW (ref 70–99)
Potassium: 3.7 mmol/L (ref 3.5–5.1)
Sodium: 137 mmol/L (ref 135–145)

## 2021-09-11 LAB — TROPONIN I (HIGH SENSITIVITY): Troponin I (High Sensitivity): <2 ng/L (ref <18)

## 2021-09-11 NOTE — ED Notes (Addendum)
Pt to ED c/o throbbing, sharp pain in central chest, mainly with exhalation and at end of inspiration "like someone grabbing and pulling" in chest. Pain is worse with walking. Denies radiation to arm, jaw or neck.   Denies dizziness. Denies SOB at this time but states this morning she felt it was hard to get enough air. Also did have mild HA which resolved.  Denies NVD, diarrhea, HA.  States everything started in January-February 2022 when was walking, got dizzy and had syncopal episode. Has had multiple episodes of same and been seen at ER and has seen cardiologist and had Holter monitor at some point.  Pt in NAD, resting in bed. Mother at bedside. NSR on monitor.

## 2021-09-11 NOTE — ED Provider Notes (Signed)
[facility] Emergency Department Provider Note   ____________________________________________   Event Date/Time   First MD Initiated Contact with Patient 09/11/21 1024     (approximate)  I have reviewed the triage vital signs and the nursing notes.   HISTORY  Chief Complaint Chest Pain (CP STARTED THIS AM 0745, PT STATES SHE HAS HISTORY OF CP , )    HPI Virginia Ochoa is a 20 y.o. female who presents for chest pain  LOCATION: Central substernal chest DURATION: 2 hours prior to arrival TIMING: Mildly improved since onset SEVERITY: 8/10 QUALITY: Sharp pain CONTEXT: Patient states she intermittently gets tachycardia, palpitations, chest pain and was told to present to the cardiologist office during a symptomatic event however when they called the cardiologist they found that the provider there was supposed to see is not sick and therefore told to go to the walk-in clinic who immediately sent him to the emergency department. MODIFYING FACTORS: Denies any exacerbating or relieving factors ASSOCIATED SYMPTOMS: Palpitations, tachycardia   Per medical record review, patient has been seen multiple times for similar symptoms with negative work-ups          Past Medical History:  Diagnosis Date   Known health problems: none 01/05/2019   Ovarian cyst     Patient Active Problem List   Diagnosis Date Noted   Ovarian cyst 10/19/2019   Menorrhagia 01/05/2019    Past Surgical History:  Procedure Laterality Date   none      Prior to Admission medications   Medication Sig Start Date End Date Taking? Authorizing Provider  ferrous sulfate 325 (65 FE) MG EC tablet Take 325 mg by mouth 3 (three) times daily with meals.    [provider]  norethindrone-ethinyl estradiol 1/35 (ORTHO-NOVUM 1/35, 28,) tablet Take 1 tablet by mouth daily. 02/12/21   Doreene Burke, CNM  vitamin C (ASCORBIC ACID) 500 MG tablet Take 500 mg by mouth daily.    [provider]    Allergies Patient has no known allergies.  Family History  Adopted: Yes    Social History Social History   Tobacco Use   Smoking status: Never   Smokeless tobacco: Never  Vaping Use   Vaping Use: Never used  Substance Use Topics   Alcohol use: Never   Drug use: Never    Review of Systems Constitutional: No fever/chills Eyes: No visual changes. ENT: No sore throat. Cardiovascular: Endorses chest pain, palpitations, tachycardia Respiratory: Denies shortness of breath. Gastrointestinal: No abdominal pain.  No nausea, no vomiting.  No diarrhea. Genitourinary: Negative for dysuria. Musculoskeletal: Negative for acute arthralgias Skin: Negative for rash. Neurological: Negative for headaches, weakness/numbness/paresthesias in any extremity Psychiatric: Negative for suicidal ideation/homicidal ideation   ____________________________________________   PHYSICAL EXAM:  VITAL SIGNS: ED Triage Vitals [09/11/21 1000]  Enc Vitals Group     BP 116/63     Pulse Rate 96     Resp 17     Temp 98.5 F (36.9 C)     Temp Source Oral     SpO2 100 %     Weight 108 lb 0.4 oz (49 kg)     Height 5\' 6"  (1.676 m)     Head Circumference      Peak Flow      Pain Score 8     Pain Loc      Pain Edu?      Excl. in GC?    Constitutional: Alert and oriented. Well appearing and in no  acute distress. Eyes: Conjunctivae are normal. PERRL. Head: Atraumatic. Nose: No congestion/rhinnorhea. Mouth/Throat: Mucous membranes are moist. Neck: No stridor Cardiovascular: Grossly normal heart sounds.  Good peripheral circulation. Respiratory: Normal respiratory effort.  No retractions. Gastrointestinal: Soft and nontender. No distention. Musculoskeletal: No obvious deformities Neurologic:  Normal speech and language. No gross focal neurologic deficits are appreciated. Skin:  Skin is warm and dry. No rash noted. Psychiatric: Mood and affect are normal. Speech and behavior are  normal.  ____________________________________________   LABS (all labs ordered are listed, but only abnormal results are displayed)  Labs Reviewed  CBC WITH DIFFERENTIAL/PLATELET - Abnormal; Notable for the following components:      Result Value   nRBC 0.4 (*)    All other components within normal limits  BASIC METABOLIC PANEL - Abnormal; Notable for the following components:   Glucose, Bld 68 (*)    All other components within normal limits  TROPONIN I (HIGH SENSITIVITY)  TROPONIN I (HIGH SENSITIVITY)   ____________________________________________  EKG  ED ECG REPORT I, Merwyn Katos, the attending physician, personally viewed and interpreted this ECG.  Date: 09/11/2021 EKG Time: 1000 Rate: 103 Rhythm: Tachycardic sinus rhythm QRS Axis: normal Intervals: normal ST/T Wave abnormalities: normal Narrative Interpretation: Tachycardic sinus rhythm.  No evidence of acute ischemia  PROCEDURES  Procedure(s) performed (including Critical Care):  .1-3 Lead EKG Interpretation Performed by: Merwyn Katos, MD Authorized by: Merwyn Katos, MD     Interpretation: normal     ECG rate:  72   ECG rate assessment: normal     Rhythm: sinus rhythm     Ectopy: none     Conduction: normal     ____________________________________________   INITIAL IMPRESSION / ASSESSMENT AND PLAN / ED COURSE  As part of my medical decision making, I reviewed the following data within the electronic medical record, if available:  Nursing notes reviewed and incorporated, Labs reviewed, EKG interpreted, Old chart reviewed, Radiograph reviewed and Notes from prior ED visits reviewed and incorporated        Workup: ECG, CXR, CBC, BMP, Troponin Findings: ECG: No overt evidence of STEMI. No evidence of Brugadas sign, delta wave, epsilon wave, significantly prolonged QTc, or malignant arrhythmia HS Troponin: Negative x1 Other Labs unremarkable for emergent problems. CXR: Without PTX, PNA, or  widened mediastinum Last Stress Test: Never Last Heart Catheterization: Never HEART Score: 1  Given History, Exam, and Workup I have low suspicion for ACS, Pneumothorax, Pneumonia, Pulmonary Embolus, Tamponade, Aortic Dissection or other emergent problem as a cause for this presentation.   Reassesment: Prior to discharge patients pain was controlled and they were well appearing.  Arrange an appointment with [facility] cardiology for this patient on 09/17/2021  Disposition:  Discharge. Strict return precautions discussed with patient with full understanding. Advised patient to follow up promptly with primary care provider      ____________________________________________   FINAL CLINICAL IMPRESSION(S) / ED DIAGNOSES  Final diagnoses:  Precordial pain  Sinus tachycardia     ED Discharge Orders     None        Note:  This document was prepared using Dragon voice recognition software and may include unintentional dictation errors.    Merwyn Katos, MD 09/11/21 [phone]

## 2021-09-11 NOTE — ED Triage Notes (Signed)
PT HERE FOR CP, PT STATES SHE IS HERE A LOT, CP STARTED TODAY 0745, PT ALSO CO OF HEADACHE AND DIZZINESS,

## 2021-10-13 ENCOUNTER — Other Ambulatory Visit: Payer: Self-pay

## 2021-10-13 ENCOUNTER — Emergency Department
Admission: EM | Admit: 2021-10-13 | Discharge: 2021-10-13 | Disposition: A | Discharge status: Home / Self Care | Payer: BC Managed Care – PPO | Attending: Emergency Medicine | Admitting: Emergency Medicine

## 2021-10-13 DIAGNOSIS — Y990 Civilian activity done for income or pay: Secondary | ICD-10-CM | POA: Insufficient documentation

## 2021-10-13 DIAGNOSIS — R55 Syncope and collapse: Secondary | ICD-10-CM

## 2021-10-13 LAB — CBC WITH DIFFERENTIAL/PLATELET
Abs Immature Granulocytes: 0.02 10*3/uL (ref 0.00–0.07)
Basophils Absolute: 0.1 10*3/uL (ref 0.0–0.1)
Basophils Relative: 1 %
Eosinophils Absolute: 0 10*3/uL (ref 0.0–0.5)
Eosinophils Relative: 1 %
HCT: 38.1 % (ref 36.0–46.0)
Hemoglobin: 13.4 g/dL (ref 12.0–15.0)
Immature Granulocytes: 0 %
Lymphocytes Relative: 22 %
Lymphs Abs: 1.6 10*3/uL (ref 0.7–4.0)
MCH: 32 pg (ref 26.0–34.0)
MCHC: 35.2 g/dL (ref 30.0–36.0)
MCV: 90.9 fL (ref 80.0–100.0)
Monocytes Absolute: 0.5 10*3/uL (ref 0.1–1.0)
Monocytes Relative: 7 %
Neutro Abs: 4.9 10*3/uL (ref 1.7–7.7)
Neutrophils Relative %: 69 %
Platelets: 268 10*3/uL (ref 150–400)
RBC: 4.19 MIL/uL (ref 3.87–5.11)
RDW: 11.3 % — ABNORMAL LOW (ref 11.5–15.5)
WBC: 7.2 10*3/uL (ref 4.0–10.5)
nRBC: 0 % (ref 0.0–0.2)

## 2021-10-13 LAB — BASIC METABOLIC PANEL
Anion gap: 6 (ref 5–15)
BUN: 9 mg/dL (ref 6–20)
CO2: 27 mmol/L (ref 22–32)
Calcium: 9 mg/dL (ref 8.9–10.3)
Chloride: 104 mmol/L (ref 98–111)
Creatinine, Ser: 0.78 mg/dL (ref 0.44–1.00)
GFR, Estimated: >60 mL/min (ref >60)
Glucose, Bld: 100 mg/dL — ABNORMAL HIGH (ref 70–99)
Potassium: 3.6 mmol/L (ref 3.5–5.1)
Sodium: 137 mmol/L (ref 135–145)

## 2021-10-13 LAB — POC URINE PREG, ED: Preg Test, Ur: NEGATIVE

## 2021-10-13 LAB — TROPONIN I (HIGH SENSITIVITY): Troponin I (High Sensitivity): <2 ng/L (ref <18)

## 2021-10-13 MED ORDER — SODIUM CHLORIDE 0.9 % IV BOLUS
1000.0000 mL | Freq: Once | INTRAVENOUS | Status: AC
  Administered 2021-10-13: 1000 mL via INTRAVENOUS

## 2021-10-13 NOTE — ED Triage Notes (Signed)
Pt BIB EMS from work for near syncope. Recent dx of POTS & she does currently have heart monitor on. Pt reports HR up to 160; HR for EMS 130s

## 2021-10-13 NOTE — ED Provider Notes (Signed)
[facility] Emergency Department Provider Note   ____________________________________________   Event Date/Time   First MD Initiated Contact with Patient 10/13/21 1922     (approximate)  I have reviewed the triage vital signs and the nursing notes.   HISTORY  Chief Complaint Near Syncope    HPI Virginia Ochoa is a 20 y.o. female with no significant past medical history presents to the ED complaining of near syncope.  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  This was associated with some tightness in her chest and difficulty catching her breath.  She tried to make it to her car to go home, but was unable to do so.  She went back into the building and nearly lost consciousness, was supported by coworkers before falling to the ground.  She now states that she is feeling back to normal, chest pain, shortness of breath, and dizziness have all resolved.  She states she has dealt with similar episodes in the past on about a monthly basis.  She is currently following with a cardiologist at [facility] and is scheduled for a tilt table test for possible diagnosis of POTS.  Patient noted to be tachycardic into the 130s with EMS, gradually improved during transport.  Patient states that she started her menstrual period earlier today.        Past Medical History:  Diagnosis Date   Known health problems: none 01/05/2019   Ovarian cyst     Patient Active Problem List   Diagnosis Date Noted   Ovarian cyst 10/19/2019   Menorrhagia 01/05/2019    Past Surgical History:  Procedure Laterality Date   none      Prior to Admission medications   Medication Sig Start Date End Date Taking? Authorizing Provider  ferrous sulfate 325 (65 FE) MG EC tablet Take 325 mg by mouth 3 (three) times daily with meals.    [provider]  norethindrone-ethinyl estradiol 1/35 (ORTHO-NOVUM 1/35,  28,) tablet Take 1 tablet by mouth daily. 02/12/21   Doreene Burke, CNM  vitamin C (ASCORBIC ACID) 500 MG tablet Take 500 mg by mouth daily.    [provider]    Allergies Patient has no known allergies.  Family History  Adopted: Yes    Social History Social History   Tobacco Use   Smoking status: Never   Smokeless tobacco: Never  Vaping Use   Vaping Use: Never used  Substance Use Topics   Alcohol use: Never   Drug use: Never    Review of Systems  Constitutional: No fever/chills Eyes: No visual changes. ENT: No sore throat. Cardiovascular: Positive for chest pain.  Positive for dizziness and near syncope. Respiratory: Positive for shortness of breath. Gastrointestinal: No abdominal pain.  No nausea, no vomiting.  No diarrhea.  No constipation. Genitourinary: Negative for dysuria. Musculoskeletal: Negative for back pain. Skin: Negative for rash. Neurological: Negative for headaches, focal weakness or numbness.  ____________________________________________   PHYSICAL EXAM:  VITAL SIGNS: ED Triage Vitals  Enc Vitals Group     BP 10/13/21 1920 120/84     Pulse Rate 10/13/21 1920 92     Resp 10/13/21 1920 19     Temp --      Temp src --      SpO2 10/13/21 1920 100 %     Weight 10/13/21 1921 100 lb (45.4 kg)     Height 10/13/21 1921 5'  6" (1.676 m)     Head Circumference --      Peak Flow --      Pain Score 10/13/21 1921 0     Pain Loc --      Pain Edu? --      Excl. in GC? --     Constitutional: Alert and oriented. Eyes: Conjunctivae are normal. Head: Atraumatic. Nose: No congestion/rhinnorhea. Mouth/Throat: Mucous membranes are moist. Neck: Normal ROM Cardiovascular: Normal rate, regular rhythm. Grossly normal heart sounds.  2+ radial pulses bilaterally. Respiratory: Normal respiratory effort.  No retractions. Lungs CTAB. Gastrointestinal: Soft and nontender. No distention. Genitourinary: deferred Musculoskeletal: No lower extremity  tenderness nor edema. Neurologic:  Normal speech and language. No gross focal neurologic deficits are appreciated. Skin:  Skin is warm, dry and intact. No rash noted. Psychiatric: Mood and affect are normal. Speech and behavior are normal.  ____________________________________________   LABS (all labs ordered are listed, but only abnormal results are displayed)  Labs Reviewed  CBC WITH DIFFERENTIAL/PLATELET - Abnormal; Notable for the following components:      Result Value   RDW 11.3 (*)    All other components within normal limits  BASIC METABOLIC PANEL - Abnormal; Notable for the following components:   Glucose, Bld 100 (*)    All other components within normal limits  POC URINE PREG, ED  TROPONIN I (HIGH SENSITIVITY)   ____________________________________________  EKG  ED ECG REPORT I, Chesley Noon, the attending physician, personally viewed and interpreted this ECG.   Date: 10/13/2021  EKG Time: 19:24  Rate: 92  Rhythm: normal sinus rhythm  Axis: Normal  Intervals:none  ST&T Change: None   PROCEDURES  Procedure(s) performed (including Critical Care):  Procedures   ____________________________________________   INITIAL IMPRESSION / ASSESSMENT AND PLAN / ED COURSE      20 year old female with no significant past medical history presents to the ED complaining of episode of dizziness, lightheadedness, chest pressure, and shortness of breath while working as a IT consultant.  Patient tachycardic with EMS, but this is since improved and patient states symptoms have resolved.  EKG shows no evidence of arrhythmia or ischemia, we will screen labs including troponin but overall low suspicion for cardiac etiology of symptoms.  We will check pregnancy test, but patient states her menstrual period started earlier today.  Low suspicion for PE given resolving symptoms and reassuring vital signs.  Pregnancy testing is negative, labs are unremarkable and troponin  within normal limits.  No events noted on cardiac monitor.  Patient is appropriate for discharge home, remains asymptomatic here in the ED.  She was counseled to follow-up with her cardiologist at [facility] and to return to the ED for new worsening symptoms, patient and mother agree with plan.      ____________________________________________   FINAL CLINICAL IMPRESSION(S) / ED DIAGNOSES  Final diagnoses:  Near syncope     ED Discharge Orders     None        Note:  This document was prepared using Dragon voice recognition software and may include unintentional dictation errors.    Chesley Noon, MD 10/13/21 2052

## 2021-10-16 ENCOUNTER — Emergency Department
Admission: EM | Admit: 2021-10-16 | Discharge: 2021-10-16 | Disposition: A | Discharge status: Home / Self Care | Payer: BC Managed Care – PPO | Attending: Emergency Medicine | Admitting: Emergency Medicine

## 2021-10-16 ENCOUNTER — Encounter: Payer: Self-pay | Admitting: Emergency Medicine

## 2021-10-16 ENCOUNTER — Other Ambulatory Visit: Payer: Self-pay

## 2021-10-16 DIAGNOSIS — R519 Headache, unspecified: Secondary | ICD-10-CM | POA: Diagnosis not present

## 2021-10-16 DIAGNOSIS — R55 Syncope and collapse: Secondary | ICD-10-CM | POA: Diagnosis present

## 2021-10-16 DIAGNOSIS — Z5321 Procedure and treatment not carried out due to patient leaving prior to being seen by health care provider: Secondary | ICD-10-CM | POA: Diagnosis not present

## 2021-10-16 LAB — CBC WITH DIFFERENTIAL/PLATELET
Abs Immature Granulocytes: 0.02 10*3/uL (ref 0.00–0.07)
Basophils Absolute: 0 10*3/uL (ref 0.0–0.1)
Basophils Relative: 1 %
Eosinophils Absolute: 0.1 10*3/uL (ref 0.0–0.5)
Eosinophils Relative: 1 %
HCT: 37.9 % (ref 36.0–46.0)
Hemoglobin: 13.6 g/dL (ref 12.0–15.0)
Immature Granulocytes: 0 %
Lymphocytes Relative: 33 %
Lymphs Abs: 2 10*3/uL (ref 0.7–4.0)
MCH: 32.3 pg (ref 26.0–34.0)
MCHC: 35.9 g/dL (ref 30.0–36.0)
MCV: 90 fL (ref 80.0–100.0)
Monocytes Absolute: 0.4 10*3/uL (ref 0.1–1.0)
Monocytes Relative: 7 %
Neutro Abs: 3.5 10*3/uL (ref 1.7–7.7)
Neutrophils Relative %: 58 %
Platelets: 286 10*3/uL (ref 150–400)
RBC: 4.21 MIL/uL (ref 3.87–5.11)
RDW: 11.1 % — ABNORMAL LOW (ref 11.5–15.5)
WBC: 6 10*3/uL (ref 4.0–10.5)
nRBC: 0 % (ref 0.0–0.2)

## 2021-10-16 LAB — COMPREHENSIVE METABOLIC PANEL
ALT: 9 U/L (ref 0–44)
AST: 20 U/L (ref 15–41)
Albumin: 3.9 g/dL (ref 3.5–5.0)
Alkaline Phosphatase: 55 U/L (ref 38–126)
Anion gap: 7 (ref 5–15)
BUN: 10 mg/dL (ref 6–20)
CO2: 24 mmol/L (ref 22–32)
Calcium: 8.9 mg/dL (ref 8.9–10.3)
Chloride: 107 mmol/L (ref 98–111)
Creatinine, Ser: 0.82 mg/dL (ref 0.44–1.00)
GFR, Estimated: >60 mL/min (ref >60)
Glucose, Bld: 94 mg/dL (ref 70–99)
Potassium: 3.5 mmol/L (ref 3.5–5.1)
Sodium: 138 mmol/L (ref 135–145)
Total Bilirubin: 0.5 mg/dL (ref 0.3–1.2)
Total Protein: 7.4 g/dL (ref 6.5–8.1)

## 2021-10-16 LAB — TROPONIN I (HIGH SENSITIVITY): Troponin I (High Sensitivity): <2 ng/L (ref <18)

## 2021-10-16 NOTE — ED Triage Notes (Signed)
Pt to triage via w/c with no distress noted, brought in by EMS after having syncopal episode at home PTA; pt denies any injuries or c/o at present other that generalized HA that she has had for several days; pt reports being seen for same and is awaiting her appt to be seen at [facility] to r/o POTS

## 2021-11-20 ENCOUNTER — Emergency Department
Admission: EM | Admit: 2021-11-20 | Discharge: 2021-11-20 | Disposition: A | Discharge status: Home / Self Care | Payer: BC Managed Care – PPO | Attending: Emergency Medicine | Admitting: Emergency Medicine

## 2021-11-20 ENCOUNTER — Other Ambulatory Visit: Payer: Self-pay

## 2021-11-20 ENCOUNTER — Emergency Department: Payer: BC Managed Care – PPO

## 2021-11-20 DIAGNOSIS — R42 Dizziness and giddiness: Secondary | ICD-10-CM | POA: Insufficient documentation

## 2021-11-20 DIAGNOSIS — R55 Syncope and collapse: Secondary | ICD-10-CM | POA: Insufficient documentation

## 2021-11-20 DIAGNOSIS — R404 Transient alteration of awareness: Secondary | ICD-10-CM | POA: Diagnosis present

## 2021-11-20 DIAGNOSIS — R002 Palpitations: Secondary | ICD-10-CM | POA: Insufficient documentation

## 2021-11-20 LAB — URINALYSIS, ROUTINE W REFLEX MICROSCOPIC
Bilirubin Urine: NEGATIVE
Glucose, UA: NEGATIVE mg/dL
Hgb urine dipstick: NEGATIVE
Ketones, ur: NEGATIVE mg/dL
Leukocytes,Ua: NEGATIVE
Nitrite: NEGATIVE
Protein, ur: 30 mg/dL — AB
Specific Gravity, Urine: 1.029 (ref 1.005–1.030)
pH: 5 (ref 5.0–8.0)

## 2021-11-20 LAB — COMPREHENSIVE METABOLIC PANEL
ALT: 11 U/L (ref 0–44)
AST: 27 U/L (ref 15–41)
Albumin: 4.1 g/dL (ref 3.5–5.0)
Alkaline Phosphatase: 56 U/L (ref 38–126)
Anion gap: 8 (ref 5–15)
BUN: 15 mg/dL (ref 6–20)
CO2: 24 mmol/L (ref 22–32)
Calcium: 9.3 mg/dL (ref 8.9–10.3)
Chloride: 105 mmol/L (ref 98–111)
Creatinine, Ser: 0.85 mg/dL (ref 0.44–1.00)
GFR, Estimated: >60 mL/min (ref >60)
Glucose, Bld: 98 mg/dL (ref 70–99)
Potassium: 3.7 mmol/L (ref 3.5–5.1)
Sodium: 137 mmol/L (ref 135–145)
Total Bilirubin: 0.7 mg/dL (ref 0.3–1.2)
Total Protein: 8 g/dL (ref 6.5–8.1)

## 2021-11-20 LAB — TROPONIN I (HIGH SENSITIVITY): Troponin I (High Sensitivity): 4 ng/L (ref <18)

## 2021-11-20 LAB — POC URINE PREG, ED: Preg Test, Ur: NEGATIVE

## 2021-11-20 LAB — CBC
HCT: 39.2 % (ref 36.0–46.0)
Hemoglobin: 13.5 g/dL (ref 12.0–15.0)
MCH: 31.3 pg (ref 26.0–34.0)
MCHC: 34.4 g/dL (ref 30.0–36.0)
MCV: 90.7 fL (ref 80.0–100.0)
Platelets: 299 10*3/uL (ref 150–400)
RBC: 4.32 MIL/uL (ref 3.87–5.11)
RDW: 11.3 % — ABNORMAL LOW (ref 11.5–15.5)
WBC: 6.3 10*3/uL (ref 4.0–10.5)
nRBC: 0 % (ref 0.0–0.2)

## 2021-11-20 LAB — CK: Total CK: 231 U/L (ref 38–234)

## 2021-11-20 MED ORDER — SODIUM CHLORIDE 0.9 % IV BOLUS: 1000.0000 mL | Freq: Once | INTRAVENOUS | Status: DC

## 2021-11-20 NOTE — ED Triage Notes (Signed)
Pt presents to ER via ems from home. Ems states they were called out for unresponsive pt.  When they arrived, pt had pinpoint pupils and was given 1 mg narcan IV with no response.  No hx of drug use.  Pt currently has GCS 3, but VSS at this time.

## 2021-11-20 NOTE — Discharge Instructions (Signed)
As we discussed, I'd recommend calling your Cardiologist or primary doctor to discuss your visit, and to seek referral to a Neurologist as an outpatient for possible eeg

## 2021-11-20 NOTE — ED Provider Notes (Signed)
[facility] Emergency Department Provider Note  ____________________________________________   Event Date/Time   First MD Initiated Contact with Patient 11/20/21 2140     (approximate)  I have reviewed the triage vital signs and the nursing notes.   HISTORY  Chief Complaint Altered Mental Status    HPI Virginia Ochoa is a 20 y.o. female  with PMHx below here with AMS. Now resolved. Pt has a long h/o recurrent syncopal episodes, has been seen here multiple times and has an Advertising account executive at [employer]. She reports that with these episodes, she feels lightheaded, with palpitations, then loses consciousness. This afternoon, pt began feeling similar to this, drove home. She then reportedly lost consciousness. EMS called, pt was minimally responsive though vitals were stable. Brought to ED. She arrived confused here but is now awake, alert. Says she feels back to baseline. Sx are similar to her recurrent episodes though the duration was longer today. No current CP, SOB. No other complaints.        Past Medical History:  Diagnosis Date   Known health problems: none 01/05/2019   Ovarian cyst     Patient Active Problem List   Diagnosis Date Noted   Ovarian cyst 10/19/2019   Menorrhagia 01/05/2019    Past Surgical History:  Procedure Laterality Date   none      Prior to Admission medications   Medication Sig Start Date End Date Taking? Authorizing Provider  ferrous sulfate 325 (65 FE) MG EC tablet Take 325 mg by mouth 3 (three) times daily with meals.    [provider]  norethindrone-ethinyl estradiol 1/35 ([place] 1/35, 28,) tablet Take 1 tablet by mouth daily. 02/12/21   Philip Aspen, CNM  vitamin C (ASCORBIC ACID) 500 MG tablet Take 500 mg by mouth daily.    [provider]    Allergies Patient has no known allergies.  Family History  Adopted: Yes    Social History Social History   Tobacco Use   Smoking status: Never    Smokeless tobacco: Never  Vaping Use   Vaping Use: Never used  Substance Use Topics   Alcohol use: Never   Drug use: Never    Review of Systems  Review of Systems  Constitutional:  Negative for fatigue and fever.  HENT:  Negative for congestion and sore throat.   Eyes:  Negative for visual disturbance.  Respiratory:  Negative for cough and shortness of breath.   Cardiovascular:  Negative for chest pain.  Gastrointestinal:  Negative for abdominal pain, diarrhea, nausea and vomiting.  Genitourinary:  Negative for flank pain.  Musculoskeletal:  Negative for back pain and neck pain.  Skin:  Negative for rash and wound.  Neurological:  Positive for syncope. Negative for weakness.  All other systems reviewed and are negative.   ____________________________________________  PHYSICAL EXAM:      VITAL SIGNS: ED Triage Vitals  Enc Vitals Group     BP 11/20/21 2133 114/79     Pulse Rate 11/20/21 2128 (!) 112     Resp 11/20/21 2128 18     Temp 11/20/21 2128 97.6 F (36.4 C)     Temp Source 11/20/21 2128 Rectal     SpO2 11/20/21 2128 100 %     Weight 11/20/21 2130 106 lb (48.1 kg)     Height 11/20/21 2130 5\' 6"  (1.676 m)     Head Circumference --      Peak Flow --      Pain Score 11/20/21  2130 0     Pain Loc --      Pain Edu? --      Excl. in [place]? --      Physical Exam Vitals and nursing note reviewed.  Constitutional:      General: She is not in acute distress.    Appearance: She is well-developed.  HENT:     Head: Normocephalic and atraumatic.  Eyes:     Conjunctiva/sclera: Conjunctivae normal.  Cardiovascular:     Rate and Rhythm: Normal rate and regular rhythm.     Heart sounds: Normal heart sounds. No murmur heard.   No friction rub.  Pulmonary:     Effort: Pulmonary effort is normal. No respiratory distress.     Breath sounds: Normal breath sounds. No wheezing or rales.  Abdominal:     General: There is no distension.     Palpations: Abdomen is soft.      Tenderness: There is no abdominal tenderness.  Musculoskeletal:     Cervical back: Neck supple.  Skin:    General: Skin is warm.     Capillary Refill: Capillary refill takes less than 2 seconds.  Neurological:     Mental Status: She is alert and oriented to person, place, and time.     GCS: GCS eye subscore is 4. GCS verbal subscore is 5. GCS motor subscore is 6.     Cranial Nerves: Cranial nerves 2-12 are intact. No cranial nerve deficit.     Sensory: Sensation is intact.     Motor: Motor function is intact. No abnormal muscle tone.     Coordination: Coordination is intact.      ____________________________________________   LABS (all labs ordered are listed, but only abnormal results are displayed)  Labs Reviewed  CBC - Abnormal; Notable for the following components:      Result Value   RDW 11.3 (*)    All other components within normal limits  URINALYSIS, ROUTINE W REFLEX MICROSCOPIC - Abnormal; Notable for the following components:   Color, Urine YELLOW (*)    APPearance HAZY (*)    Protein, ur 30 (*)    Bacteria, UA RARE (*)    All other components within normal limits  POC URINE PREG, ED - Normal  COMPREHENSIVE METABOLIC PANEL  CK  TROPONIN I (HIGH SENSITIVITY)    ____________________________________________  EKG: Sinus tachycardia, VR 109. PR 125, QRS 88, QTc 480. No acute St elevations or depressions. ________________________________________  RADIOLOGY All imaging, including plain films, CT scans, and ultrasounds, independently reviewed by me, and interpretations confirmed via formal radiology reads.  ED MD interpretation:   CT Head: [place]  Official radiology report(s): CT HEAD WO CONTRAST (5MM)  Result Date: 11/20/2021 CLINICAL DATA:  Seizure EXAM: CT HEAD WITHOUT CONTRAST TECHNIQUE: Contiguous axial images were obtained from the base of the skull through the vertex without intravenous contrast. COMPARISON:  None. FINDINGS: Brain: No evidence of acute  infarction, hemorrhage, hydrocephalus, extra-axial collection or mass lesion/mass effect. Vascular: No hyperdense vessel or unexpected calcification. Skull: Normal. Negative for fracture or focal lesion. Sinuses/Orbits: No acute finding. Other: None IMPRESSION: Negative non contrasted CT appearance of the brain Electronically Signed   By: Donavan Foil M.D.   On: 11/20/2021 22:39    ____________________________________________  PROCEDURES   Procedure(s) performed (including Critical Care):  Procedures  ____________________________________________  INITIAL IMPRESSION / MDM / [place] / ED COURSE  As part of my medical decision making, I reviewed the following data within the  electronic MEDICAL RECORD NUMBER Nursing notes reviewed and incorporated, Old chart reviewed, Notes from prior ED visits, and [place] Controlled Substance Database       *Virginia Ochoa was evaluated in Emergency Department on 11/21/2021 for the symptoms described in the history of present illness. She was evaluated in the context of the global COVID-19 pandemic, which necessitated consideration that the patient might be at risk for infection with the SARS-CoV-2 virus that causes COVID-19. Institutional protocols and algorithms that pertain to the evaluation of patients at risk for COVID-19 are in a state of rapid change based on information released by regulatory bodies including the CDC and federal and state organizations. These policies and algorithms were followed during the patient's care in the ED.  Some ED evaluations and interventions may be delayed as a result of limited staffing during the pandemic.*     Medical Decision Making:  20 yo F here with transient AMS/LOC. Pt has a well documented h/o similar events, though current event reportedly lasted longer than usual. Though she was not responding on arrival here, VSS and pt quickly woke up, returned to baseline with no significant confusion. Labs obtained,  reviewed and are very reassuring. No leukocytosis. Normal CK. CMP unremarkable. CT head shows NAICA. Tele without ectopy and EKG is NSR, and pt has had extensive reassuring cardiac evaluations.  Etiology of episodes unclear. She has seen a Cardiologist with thought of syncopal episodes 2/2 POTS, though I would consider possible partial complex seizures as well. Pt has no generalized seizure activity with normal CK and WBC, doubt grand mal or occult status.   Given pt is back to baseline with h/o similar episodes and reassuring work-up, will d/c with outpt follow-up, including neuro. Return precautions given.  ____________________________________________  FINAL CLINICAL IMPRESSION(S) / ED DIAGNOSES  Final diagnoses:  Transient alteration of awareness     MEDICATIONS GIVEN DURING THIS VISIT:  Medications  sodium chloride 0.9 % bolus 1,000 mL (1,000 mLs Intravenous Not Given 11/20/21 2300)     ED Discharge Orders     None        Note:  This document was prepared using Dragon voice recognition software and may include unintentional dictation errors.   Shaune Pollack, MD 11/21/21 [phone]

## 2021-11-22 ENCOUNTER — Emergency Department: Payer: BC Managed Care – PPO

## 2021-11-22 ENCOUNTER — Other Ambulatory Visit: Payer: Self-pay

## 2021-11-22 ENCOUNTER — Emergency Department
Admission: EM | Admit: 2021-11-22 | Discharge: 2021-11-22 | Disposition: A | Discharge status: Home / Self Care | Payer: BC Managed Care – PPO | Attending: Emergency Medicine | Admitting: Emergency Medicine

## 2021-11-22 DIAGNOSIS — R404 Transient alteration of awareness: Secondary | ICD-10-CM

## 2021-11-22 DIAGNOSIS — R4182 Altered mental status, unspecified: Secondary | ICD-10-CM | POA: Insufficient documentation

## 2021-11-22 DIAGNOSIS — R55 Syncope and collapse: Secondary | ICD-10-CM | POA: Diagnosis not present

## 2021-11-22 LAB — COMPREHENSIVE METABOLIC PANEL
ALT: 13 U/L (ref 0–44)
AST: 30 U/L (ref 15–41)
Albumin: 4.1 g/dL (ref 3.5–5.0)
Alkaline Phosphatase: 54 U/L (ref 38–126)
Anion gap: 5 (ref 5–15)
BUN: 14 mg/dL (ref 6–20)
CO2: 26 mmol/L (ref 22–32)
Calcium: 9 mg/dL (ref 8.9–10.3)
Chloride: 105 mmol/L (ref 98–111)
Creatinine, Ser: 0.97 mg/dL (ref 0.44–1.00)
GFR, Estimated: >60 mL/min (ref >60)
Glucose, Bld: 101 mg/dL — ABNORMAL HIGH (ref 70–99)
Potassium: 4.2 mmol/L (ref 3.5–5.1)
Sodium: 136 mmol/L (ref 135–145)
Total Bilirubin: 1.1 mg/dL (ref 0.3–1.2)
Total Protein: 7.7 g/dL (ref 6.5–8.1)

## 2021-11-22 LAB — CBC WITH DIFFERENTIAL/PLATELET
Abs Immature Granulocytes: 0.01 10*3/uL (ref 0.00–0.07)
Basophils Absolute: 0 10*3/uL (ref 0.0–0.1)
Basophils Relative: 1 %
Eosinophils Absolute: 0 10*3/uL (ref 0.0–0.5)
Eosinophils Relative: 0 %
HCT: 38.8 % (ref 36.0–46.0)
Hemoglobin: 13.4 g/dL (ref 12.0–15.0)
Immature Granulocytes: 0 %
Lymphocytes Relative: 27 %
Lymphs Abs: 1.2 10*3/uL (ref 0.7–4.0)
MCH: 31.2 pg (ref 26.0–34.0)
MCHC: 34.5 g/dL (ref 30.0–36.0)
MCV: 90.2 fL (ref 80.0–100.0)
Monocytes Absolute: 0.4 10*3/uL (ref 0.1–1.0)
Monocytes Relative: 10 %
Neutro Abs: 2.7 10*3/uL (ref 1.7–7.7)
Neutrophils Relative %: 62 %
Platelets: 294 10*3/uL (ref 150–400)
RBC: 4.3 MIL/uL (ref 3.87–5.11)
RDW: 11.5 % (ref 11.5–15.5)
WBC: 4.5 10*3/uL (ref 4.0–10.5)
nRBC: 0 % (ref 0.0–0.2)

## 2021-11-22 LAB — CBG MONITORING, ED: Glucose-Capillary: 76 mg/dL (ref 70–99)

## 2021-11-22 NOTE — Progress Notes (Signed)
   11/22/21 1715  Clinical Encounter Type  Visited With Patient and family together  Visit Type Initial;Spiritual support;Social support  Referral From Chaplain  Spiritual Encounters  Spiritual Needs Emotional  Luna Fuse provided active listening and compassionate presence for Pt's father. Family coping with repeat episodes without clear diagnosis. Chaplain Burrs validated father's concerns and encouraged self-care. Offered silent prayer at bedside. Luna Fuse also engaged care team. Will continue to follow.

## 2021-11-22 NOTE — ED Notes (Signed)
PT father asking to speak with the ER doctor and to be transferred to [facility] "if there is nothing that we can do here." ER physician is aware that father would like to speak to them.

## 2021-11-22 NOTE — ED Provider Notes (Signed)
[facility]  ____________________________________________   Event Date/Time   First MD Initiated Contact with Patient 11/22/21 1714     (approximate)  I have reviewed the triage vital signs and the nursing notes.   HISTORY  Chief Complaint Loss of Consciousness    HPI Virginia Ochoa is a 20 y.o. female with past medical history of multiple syncopal episodes and questionable POTS disease who presents with decreased level of consciousness.  Patient was eating dinner at her boyfriend's house when she felt lightheaded and then passed out.  She fell to the floor.  She has not woken since this time.  Patient's father notes that she has had multiple episodes similar to this over the past 4 months.  She usually has a preceding prodrome of lightheadedness headache and then loses consciousness.  She will remain unconscious for quite a while.  Initially was only for about 10 minutes and now the episodes are becoming more prolonged.  She was seen in the ED here 2 days ago for similar episode during which she was on consciousness for about 45 minutes total.  She has had CAT scans of her brain as well as blood work and is also had Holter monitoring and cardiac work-up.  Has not yet seen neurology.         Past Medical History:  Diagnosis Date   Known health problems: none 01/05/2019   Ovarian cyst     Patient Active Problem List   Diagnosis Date Noted   Ovarian cyst 10/19/2019   Menorrhagia 01/05/2019    Past Surgical History:  Procedure Laterality Date   none      Prior to Admission medications   Medication Sig Start Date End Date Taking? Authorizing Provider  ferrous sulfate 325 (65 FE) MG EC tablet Take 325 mg by mouth 3 (three) times daily with meals.    [provider]  norethindrone-ethinyl estradiol 1/35 (ORTHO-NOVUM 1/35, 28,) tablet Take 1 tablet by mouth daily. 02/12/21   Doreene Burke, CNM  vitamin C (ASCORBIC ACID) 500 MG tablet Take 500  mg by mouth daily.    [provider]    Allergies Patient has no known allergies.  Family History  Adopted: Yes    Social History Social History   Tobacco Use   Smoking status: Never   Smokeless tobacco: Never  Vaping Use   Vaping Use: Never used  Substance Use Topics   Alcohol use: Never   Drug use: Never    Review of Systems   Review of Systems  Unable to perform ROS: Patient unresponsive   Physical Exam Updated Vital Signs BP 92/64 (BP Location: Right Arm)   Pulse 88   Temp 98.1 F (36.7 C) (Oral)   Resp 17   Ht 5\' 6"  (1.676 m)   Wt 46.7 kg   SpO2 100%   BMI 16.62 kg/m   Physical Exam Vitals and nursing note reviewed.  Constitutional:      General: She is not in acute distress. HENT:     Head: Normocephalic and atraumatic.  Eyes:     General: No scleral icterus.    Conjunctiva/sclera: Conjunctivae normal.  Cardiovascular:     Rate and Rhythm: Normal rate and regular rhythm.  Pulmonary:     Effort: Pulmonary effort is normal. No respiratory distress.     Breath sounds: No stridor.  Abdominal:     General: Abdomen is flat.  Musculoskeletal:        General: No deformity or  signs of injury.     Cervical back: Normal range of motion.  Skin:    General: Skin is dry.     Coloration: Skin is not jaundiced or pale.  Neurological:     Comments: Patient does not have any response to verbal stimulus for pain, she has resting comfortably, protecting her airway with nonlabored respirations  Psychiatric:     Comments: Unable to assess     LABS (all labs ordered are listed, but only abnormal results are displayed)  Labs Reviewed  COMPREHENSIVE METABOLIC PANEL - Abnormal; Notable for the following components:      Result Value   Glucose, Bld 101 (*)    All other components within normal limits  CBC WITH DIFFERENTIAL/PLATELET  CBG MONITORING, ED   ____________________________________________  EKG  NSR, nml axis, nml intervals, no acute  ischemic changes  ____________________________________________  RADIOLOGY Ky Barban, personally viewed and evaluated these images (plain radiographs) as part of my medical decision making, as well as reviewing the written report by the radiologist.  ED MD interpretation:  n/a    ____________________________________________   PROCEDURES  Procedure(s) performed (including Critical Care):  Procedures   ____________________________________________   INITIAL IMPRESSION / ASSESSMENT AND PLAN / ED COURSE   Patient is an 20 year old female who presents with loss of consciousness and prolonged period of unconsciousness afterward.  Dad notes that this has been an ongoing problem and she was just seen in the ED 2 days ago for similar.  During that time after about 45 minutes she awoke and was back to baseline.  CT head was done as well as labs which are reassuring.  My initial evaluation the patient has stable vital signs and she is breathing unlabored on protecting her airway.  She does does not respond to pain.  Patient does not appear to be seizing she has no gaze deviation and vital signs do not suggest active seizure, only there has never been any noted seizure-like activity.  I suspect that this is largely functional.  After about an hour in the emergency department the patient woke up and was completely back to baseline without any focal neurologic deficits or any complaints.  Patient's father very concerned about these episodes and would like her to be admitted for further work-up and to speak seen by neurologist.  Discussed the case with Dr. Iver Nestle neurologist on-call who agrees that this is likely functional and feels that a routine EEG tomorrow or MRI is very unlikely to find anything and she did not feel the patient should be admitted and advised that I reiterate this to the family.  I then had another long discussion with both the patient and her father about the potential that  this is related to an underlying functional disorder and explained that may consider speaking with a therapist or psychiatrist in addition to routine cardiology and neurology follow-up.  Patient ultimately decided that she did not want to stay although her father did want her still to be admitted. ____________________________________________   FINAL CLINICAL IMPRESSION(S) / ED DIAGNOSES  Final diagnoses:  Altered level of consciousness     ED Discharge Orders     None        Note:  This document was prepared using Dragon voice recognition software and may include unintentional dictation errors.    Georga Hacking, MD 11/22/21 [phone]

## 2021-11-22 NOTE — ED Notes (Signed)
PT remains unresponsive at this time. VSS remain stable.

## 2021-11-22 NOTE — ED Notes (Signed)
Unable to complete triage due to patient being unresponsive.

## 2021-11-22 NOTE — ED Notes (Signed)
Pt is awake, alert, and oriented x 4 at this time.

## 2021-11-22 NOTE — ED Triage Notes (Signed)
PT from home via [facility]. Per EMS pt has a sudden LOC while sitting at a table. Pt is unresponsive at this time. MD at the bedside.

## 2021-11-28 ENCOUNTER — Ambulatory Visit (INDEPENDENT_AMBULATORY_CARE_PROVIDER_SITE_OTHER): Payer: BC Managed Care – PPO | Admitting: Cardiology

## 2021-11-28 ENCOUNTER — Encounter: Payer: Self-pay | Admitting: Cardiology

## 2021-11-28 ENCOUNTER — Other Ambulatory Visit: Payer: Self-pay

## 2021-11-28 VITALS — BP 98/61 | HR 81 | Ht 66.0 in | Wt 101.0 lb

## 2021-11-28 DIAGNOSIS — R55 Syncope and collapse: Secondary | ICD-10-CM

## 2021-11-28 NOTE — Progress Notes (Signed)
Electrophysiology Office Note:    Date:  11/28/2021   ID:  Virginia Ochoa, DOB 2001-08-22, MRN [id number]  PCP:  Margaretann Loveless, MD  [facility] Cardiologist:  None  [facility] Electrophysiologist:  Lanier Prude, MD   Referring MD: Fayrene Helper, NP   Chief Complaint: Syncope  History of Present Illness:    Virginia Ochoa is a 20 y.o. female who presents for an evaluation of syncope at the request of Meryl Crutch, NP. Their medical history includes multiple syncopal episodes.  The patient has been seen in the emergency department multiple times with syncopal episodes.  One of them is reported as a 45-minute period of unconsciousness.  The patient has been seen by cardiology at [facility].  She has previously worn a 1 week Holter monitor which was unrevealing.  They recommended increased salt intake and increased hydration.  The [facility] provider started her on midodrine 3 times a day and recommended that she wear compression stockings and an abdominal binder. She is adopted so her family history is unknown.  She tells me that all of her episodes of passing out are very similar.  She recently passed out on Thursday and was unconscious for over 1 hour.  The episodes are getting more frequent and are lasting longer.  She is with her mother today in clinic.    Past Medical History:  Diagnosis Date   Known health problems: none 01/05/2019   Ovarian cyst     Past Surgical History:  Procedure Laterality Date   none      Current Medications: Current Meds  Medication Sig   ferrous sulfate 325 (65 FE) MG EC tablet Take 325 mg by mouth 3 (three) times daily with meals.   midodrine (PROAMATINE) 2.5 MG tablet Take 2.5 mg by mouth 3 (three) times daily.   norethindrone-ethinyl estradiol 1/35 (ORTHO-NOVUM 1/35, 28,) tablet Take 1 tablet by mouth daily.   vitamin C (ASCORBIC ACID) 500 MG tablet Take 500 mg by mouth daily.     Allergies:   Patient has no  known allergies.   Social History   Socioeconomic History   Marital status: Single    Spouse name: Not on file   Number of children: Not on file   Years of education: Not on file   Highest education level: Not on file  Occupational History   Not on file  Tobacco Use   Smoking status: Never   Smokeless tobacco: Never  Vaping Use   Vaping Use: Never used  Substance and Sexual Activity   Alcohol use: Never   Drug use: Never   Sexual activity: Never    Birth control/protection: I.U.D.  Other Topics Concern   Not on file  Social History Narrative   Not on file   Social Determinants of Health   Financial Resource Strain: Not on file  Food Insecurity: Not on file  Transportation Needs: Not on file  Physical Activity: Not on file  Stress: Not on file  Social Connections: Not on file     Family History: The patient's family history is not on file. She was adopted.  ROS:   Please see the history of present illness.    All other systems reviewed and are negative.  EKGs/Labs/Other Studies Reviewed:    The following studies were reviewed today:  September 24, 2021 [employer] echo Normal left and right ventricles No significant valvular abnormalities  EKG:  The ekg ordered today demonstrates sinus rhythm.  Normal intervals.  Normal EKG.   Recent Labs: 11/22/2021: ALT 13; BUN 14; Creatinine, Ser 0.97; Hemoglobin 13.4; Platelets 294; Potassium 4.2; Sodium 136  Recent Lipid Panel No results found for: CHOL, TRIG, HDL, CHOLHDL, VLDL, LDLCALC, LDLDIRECT  Physical Exam:    VS:  BP 98/61 (BP Location: Right Arm, Patient Position: Sitting, Cuff Size: Normal)   Pulse 81   Ht 5\' 6"  (1.676 m)   Wt 101 lb (45.8 kg)   SpO2 98%   BMI 16.30 kg/m     Wt Readings from Last 3 Encounters:  11/28/21 101 lb (45.8 kg) (5 %, Z= -1.67)*  11/22/21 103 lb (46.7 kg) (7 %, Z= -1.49)*  11/20/21 106 lb (48.1 kg) (11 %, Z= -1.25)*   * Growth percentiles are based on CDC (Girls,  2-20 Years) data.    Orthostatics Flat 84, 96/62 Sitting 94, 99/63 Standing 103, 111/73  Standing 3 minutes 106, 100/68  GEN:  Well nourished, well developed in no acute distress HEENT: Normal NECK: No JVD; No carotid bruits LYMPHATICS: No lymphadenopathy CARDIAC: RRR, no murmurs, rubs, gallops RESPIRATORY:  Clear to auscultation without rales, wheezing or rhonchi  ABDOMEN: Soft, non-tender, non-distended MUSCULOSKELETAL:  No edema; No deformity  SKIN: Warm and dry NEUROLOGIC:  Alert and oriented x 3 PSYCHIATRIC:  Normal affect       ASSESSMENT:    1. Syncope and collapse    PLAN:    In order of problems listed above:  #Syncope This is not consistent with cardiac syncope.  Her EKG is normal.  She has no evidence of POTS.  This is not consistent with dysautonomia.    I am most concerned that she is having a conversion disorder.  I discussed this at length with the patient and her mother during today's appointment.  I think good follow-up with primary care and mental health professionals is the most important thing right now.  I will put in a referral to 11/22/21, PsyD.  Follow-up with me on an as-needed basis.  Medication Adjustments/Labs and Tests Ordered: Current medicines are reviewed at length with the patient today.  Concerns regarding medicines are outlined above.  Orders Placed This Encounter  Procedures   EKG 12-Lead    No orders of the defined types were placed in this encounter.    Signed, Georgiann Mohs. Rossie Muskrat, MD, [facility], [facility] 11/28/2021 11:18 AM    Electrophysiology [facility]

## 2021-11-28 NOTE — Patient Instructions (Addendum)
Medication Instructions:  Your physician recommends that you continue on your current medications as directed. Please refer to the Current Medication list given to you today. *If you need a refill on your cardiac medications before your next appointment, please call your pharmacy*  Lab Work: None ordered. If you have labs (blood work) drawn today and your tests are completely normal, you will receive your results only by: MyChart Message (if you have MyChart) OR A paper copy in the mail If you have any lab test that is abnormal or we need to change your treatment, we will call you to review the results.  Testing/Procedures: None ordered.  Follow-Up: At [facility], you and your health needs are our priority.  As part of our continuing mission to provide you with exceptional heart care, we have created designated Provider Care Teams.  These Care Teams include your primary Cardiologist (physician) and Advanced Practice Providers (APPs -  Physician Assistants and Nurse Practitioners) who all work together to provide you with the care you need, when you need it.  Your next appointment:   Your physician wants you to follow-up in: as needed with [name] are being referred to [facility].

## 2022-01-02 ENCOUNTER — Emergency Department
Admission: EM | Admit: 2022-01-02 | Discharge: 2022-01-03 | Disposition: A | Discharge status: Home / Self Care | Payer: BC Managed Care – PPO | Source: Home / Self Care | Attending: Emergency Medicine | Admitting: Emergency Medicine

## 2022-01-02 DIAGNOSIS — R400 Somnolence: Secondary | ICD-10-CM | POA: Diagnosis not present

## 2022-01-02 DIAGNOSIS — R569 Unspecified convulsions: Secondary | ICD-10-CM | POA: Insufficient documentation

## 2022-01-02 DIAGNOSIS — R55 Syncope and collapse: Secondary | ICD-10-CM | POA: Insufficient documentation

## 2022-01-02 DIAGNOSIS — N9489 Other specified conditions associated with female genital organs and menstrual cycle: Secondary | ICD-10-CM | POA: Insufficient documentation

## 2022-01-02 DIAGNOSIS — W1839XA Other fall on same level, initial encounter: Secondary | ICD-10-CM | POA: Diagnosis not present

## 2022-01-02 DIAGNOSIS — Z20822 Contact with and (suspected) exposure to covid-19: Secondary | ICD-10-CM | POA: Diagnosis not present

## 2022-01-02 DIAGNOSIS — R4020 Unspecified coma: Secondary | ICD-10-CM

## 2022-01-02 LAB — URINALYSIS, COMPLETE (UACMP) WITH MICROSCOPIC
Bacteria, UA: NONE SEEN
Bilirubin Urine: NEGATIVE
Glucose, UA: NEGATIVE mg/dL
Hgb urine dipstick: NEGATIVE
Ketones, ur: NEGATIVE mg/dL
Leukocytes,Ua: NEGATIVE
Nitrite: NEGATIVE
Protein, ur: NEGATIVE mg/dL
Specific Gravity, Urine: 1.015 (ref 1.005–1.030)
pH: 6 (ref 5.0–8.0)

## 2022-01-02 LAB — URINE DRUG SCREEN, QUALITATIVE (ARMC ONLY)
Amphetamines, Ur Screen: NOT DETECTED
Barbiturates, Ur Screen: NOT DETECTED
Benzodiazepine, Ur Scrn: NOT DETECTED
Cannabinoid 50 Ng, Ur ~~LOC~~: NOT DETECTED
Cocaine Metabolite,Ur ~~LOC~~: NOT DETECTED
MDMA (Ecstasy)Ur Screen: NOT DETECTED
Methadone Scn, Ur: NOT DETECTED
Opiate, Ur Screen: NOT DETECTED
Phencyclidine (PCP) Ur S: NOT DETECTED
Tricyclic, Ur Screen: NOT DETECTED

## 2022-01-02 LAB — BASIC METABOLIC PANEL
Anion gap: 9 (ref 5–15)
BUN: 15 mg/dL (ref 6–20)
CO2: 26 mmol/L (ref 22–32)
Calcium: 9.2 mg/dL (ref 8.9–10.3)
Chloride: 102 mmol/L (ref 98–111)
Creatinine, Ser: 0.72 mg/dL (ref 0.44–1.00)
GFR, Estimated: >60 mL/min (ref >60)
Glucose, Bld: 93 mg/dL (ref 70–99)
Potassium: 3.5 mmol/L (ref 3.5–5.1)
Sodium: 137 mmol/L (ref 135–145)

## 2022-01-02 LAB — CBC WITH DIFFERENTIAL/PLATELET
Abs Immature Granulocytes: 0.02 10*3/uL (ref 0.00–0.07)
Basophils Absolute: 0 10*3/uL (ref 0.0–0.1)
Basophils Relative: 0 %
Eosinophils Absolute: 0.1 10*3/uL (ref 0.0–0.5)
Eosinophils Relative: 1 %
HCT: 37.1 % (ref 36.0–46.0)
Hemoglobin: 13 g/dL (ref 12.0–15.0)
Immature Granulocytes: 0 %
Lymphocytes Relative: 24 %
Lymphs Abs: 2 10*3/uL (ref 0.7–4.0)
MCH: 31.6 pg (ref 26.0–34.0)
MCHC: 35 g/dL (ref 30.0–36.0)
MCV: 90 fL (ref 80.0–100.0)
Monocytes Absolute: 0.5 10*3/uL (ref 0.1–1.0)
Monocytes Relative: 6 %
Neutro Abs: 5.6 10*3/uL (ref 1.7–7.7)
Neutrophils Relative %: 69 %
Platelets: 284 10*3/uL (ref 150–400)
RBC: 4.12 MIL/uL (ref 3.87–5.11)
RDW: 11.3 % — ABNORMAL LOW (ref 11.5–15.5)
WBC: 8.1 10*3/uL (ref 4.0–10.5)
nRBC: 0 % (ref 0.0–0.2)

## 2022-01-02 NOTE — ED Triage Notes (Signed)
Pt brought from home after a 2-3 min seizure. Pt is unresponsive to stimuli at this time. VS are within normal limits.

## 2022-01-02 NOTE — ED Provider Notes (Signed)
[facility] Provider Note    Event Date/Time   First MD Initiated Contact with Patient 01/02/22 2258     (approximate)   History   Seizures   HPI  Virginia Ochoa is a 21 y.o. female presents for evaluation after an episode of loss of consciousness.  Patient reports that she for started having episodes of passing out a year ago.  She had 1 on January 2022.  She did have another one for several months.  Over the last couple of months she has been having 2-3 a month.  She usually feels dizzy like she is going to pass out, her apple watch will tell her that her heart rates really high and she will pass out.  According to family, she will collapse and stay unresponsive anywhere from a couple of minutes to over an hour.  She breathes normally and her pulse is normal but she will not respond to any form of stimuli.  When she wakes up she is never confused and knows exactly where she is.  The episode today happened like of the other ones that she was out for about 35 minutes when she started shaking.  She had generalized shaking including both sides of her body that lasted for 2.5 seconds.  This had never happened before which prompted the family to bring her back to the emergency room.  At this time she is fully awake and back to baseline.  She denies any injuries from this episode.  She has no headache, no chest pain, no diplopia, dysarthria, dysphagia, unilateral weakness or numbness, no chest pain or shortness of breath.  No recent illnesses.  She reports that she has been unable to determine any triggers for these episodes.  She is adopted therefore does not know her family history.  She denies any drug or alcohol use.  She denies any history of depression or anxiety.  She reports that these episodes happen mostly at night.  She has been seen by 2 cardiologist and wore a Holter monitor.  She had 2-3 episodes while wearing the Holter monitor and that did not show any signs of  dysrhythmias.  She has an appointment to establish care with neurology tomorrow at 12:30 PM.     Past Medical History:  Diagnosis Date   Known health problems: none 01/05/2019   Ovarian cyst     Past Surgical History:  Procedure Laterality Date   none       Physical Exam   Triage Vital Signs: ED Triage Vitals [01/02/22 2236]  Enc Vitals Group     BP 118/86     Pulse Rate 84     Resp 16     Temp 98.8 F (37.1 C)     Temp Source Oral     SpO2 100 %     Weight      Height      Head Circumference      Peak Flow      Pain Score      Pain Loc      Pain Edu?      Excl. in GC?     Most recent vital signs: Vitals:   01/02/22 2236  BP: 118/86  Pulse: 84  Resp: 16  Temp: 98.8 F (37.1 C)  SpO2: 100%     Constitutional: Alert and oriented. Well appearing and in no apparent distress. HEENT:      Head: Normocephalic and atraumatic.  Eyes: Conjunctivae are normal. Sclera is non-icteric.       Mouth/Throat: Mucous membranes are moist.       Neck: Supple with no signs of meningismus. Cardiovascular: Regular rate and rhythm. No murmurs, gallops, or rubs. 2+ symmetrical distal pulses are present in all extremities.  Respiratory: Normal respiratory effort. Lungs are clear to auscultation bilaterally.  Gastrointestinal: Soft, non tender, and non distended with positive bowel sounds. No rebound or guarding. Genitourinary: No CVA tenderness. Musculoskeletal:  No edema, cyanosis, or erythema of extremities. Neurologic: Normal speech and language. Face is symmetric.  Intact strength and sensation x4, no pronator drift, no dysmetria, normal gait skin: Skin is warm, dry and intact. No rash noted. Psychiatric: Mood and affect are normal. Speech and behavior are normal.  ED Results / Procedures / Treatments   Labs (all labs ordered are listed, but only abnormal results are displayed) Labs Reviewed  CBC WITH DIFFERENTIAL/PLATELET - Abnormal; Notable for the following  components:      Result Value   RDW 11.3 (*)    All other components within normal limits  URINALYSIS, COMPLETE (UACMP) WITH MICROSCOPIC  URINE DRUG SCREEN, QUALITATIVE ([facility] ONLY)  BASIC METABOLIC PANEL  HCG, QUANTITATIVE, PREGNANCY  ETHANOL     EKG  ED ECG REPORT I, Nita Sickle, the attending physician, personally viewed and interpreted this ECG.  Sinus rhythm with a rate of 93, normal intervals, normal axis, no ST elevations or depressions.  Normal EKG.   RADIOLOGY none   PROCEDURES:  Critical Care performed: No  Procedures    IMPRESSION / MDM / ASSESSMENT AND PLAN / ED COURSE  I reviewed the triage vital signs and the nursing notes.  21 y.o. female presents for evaluation after an episode of loss of consciousness with shaking.  Patient with several episodes of loss of consciousness over the last year with a negative cardiac work-up including Holter monitoring showing no signs of dysrhythmias during these episodes.  Today was the first episode that she had generalized shaking.  Unknown family history.  Patient is now well-appearing and in no distress with normal vital signs, fully awake and alert with a GCS of 15, completely neurologically intact.  Ddx: Seizures versus vasovagal versus anxiety/panic attacks versus POTS.  Less likely dysrhythmias since patient had 3 episodes while wearing a Holter monitor and that did not show any signs of dysrhythmias.   Plan: CBC, BMP, hCG, urinalysis, UDS.  Patient placed on telemetry for close monitoring of cardiorespiratory status.   MEDICATIONS GIVEN IN ED: Medications - No data to display   ED COURSE: Work-up essentially unremarkable.  UA negative for UTI, UDS negative for drug use.  CBC with no signs of leukocytosis or anemia.  No significant electrolyte derangements with a normal metabolic panel.  Pregnancy test negative.  Patient was monitored on telemetry with no recurrence of her symptoms.  We did discuss admission  for neurology evaluation and an EEG but since she has an appointment with neurology tomorrow she prefers to go home and see them as an outpatient.  She is here with her boyfriend and father who are both comfortable taking her home.  She will not be alone and will be with them throughout the night.  I discussed my standard return precautions.  We did discuss for a long time seizure precautions specially driving and the dangers associated with it.  She is aware that she should not be driving.   Consults: none   EMR reviewed reviewed patient's EMR including  her visit with cardiology from November 2022 where they ruled out a cardiac syncope and also felt that this was not POTS.  They are leaving concerning diagnosis is conversion disorder and she has been referred to a psychologist.  She has an appointment coming up with that as well. She has been placed on midodrine for possible POTS with no change.          FINAL CLINICAL IMPRESSION(S) / ED DIAGNOSES   Final diagnoses:  Loss of consciousness (HCC)     Rx / DC Orders   ED Discharge Orders     None        Note:  This document was prepared using Dragon voice recognition software and may include unintentional dictation errors.    Don PerkingVeronese, [place], MD 01/03/22 [phone]

## 2022-01-03 ENCOUNTER — Inpatient Hospital Stay: Payer: BC Managed Care – PPO

## 2022-01-03 ENCOUNTER — Other Ambulatory Visit: Payer: Self-pay

## 2022-01-03 ENCOUNTER — Observation Stay
Admission: EM | Admit: 2022-01-03 | Discharge: 2022-01-04 | Disposition: A | Discharge status: Home / Self Care | Payer: BC Managed Care – PPO | Attending: Hospitalist | Admitting: Hospitalist

## 2022-01-03 DIAGNOSIS — Z20822 Contact with and (suspected) exposure to covid-19: Secondary | ICD-10-CM | POA: Insufficient documentation

## 2022-01-03 DIAGNOSIS — W1839XA Other fall on same level, initial encounter: Secondary | ICD-10-CM | POA: Insufficient documentation

## 2022-01-03 DIAGNOSIS — R55 Syncope and collapse: Principal | ICD-10-CM | POA: Diagnosis present

## 2022-01-03 DIAGNOSIS — R400 Somnolence: Secondary | ICD-10-CM | POA: Insufficient documentation

## 2022-01-03 DIAGNOSIS — R42 Dizziness and giddiness: Secondary | ICD-10-CM

## 2022-01-03 DIAGNOSIS — R519 Headache, unspecified: Secondary | ICD-10-CM | POA: Diagnosis present

## 2022-01-03 DIAGNOSIS — W19XXXA Unspecified fall, initial encounter: Secondary | ICD-10-CM

## 2022-01-03 DIAGNOSIS — R040 Epistaxis: Secondary | ICD-10-CM | POA: Diagnosis present

## 2022-01-03 HISTORY — DX: Syncope and collapse: R55

## 2022-01-03 LAB — CBC WITH DIFFERENTIAL/PLATELET
Abs Immature Granulocytes: 0.02 10*3/uL (ref 0.00–0.07)
Basophils Absolute: 0 10*3/uL (ref 0.0–0.1)
Basophils Relative: 1 %
Eosinophils Absolute: 0.1 10*3/uL (ref 0.0–0.5)
Eosinophils Relative: 1 %
HCT: 38.1 % (ref 36.0–46.0)
Hemoglobin: 13 g/dL (ref 12.0–15.0)
Immature Granulocytes: 0 %
Lymphocytes Relative: 26 %
Lymphs Abs: 2.1 10*3/uL (ref 0.7–4.0)
MCH: 31 pg (ref 26.0–34.0)
MCHC: 34.1 g/dL (ref 30.0–36.0)
MCV: 90.7 fL (ref 80.0–100.0)
Monocytes Absolute: 0.6 10*3/uL (ref 0.1–1.0)
Monocytes Relative: 7 %
Neutro Abs: 5.2 10*3/uL (ref 1.7–7.7)
Neutrophils Relative %: 65 %
Platelets: 310 10*3/uL (ref 150–400)
RBC: 4.2 MIL/uL (ref 3.87–5.11)
RDW: 11.5 % (ref 11.5–15.5)
WBC: 8 10*3/uL (ref 4.0–10.5)
nRBC: 0 % (ref 0.0–0.2)

## 2022-01-03 LAB — BASIC METABOLIC PANEL
Anion gap: 7 (ref 5–15)
BUN: 11 mg/dL (ref 6–20)
CO2: 26 mmol/L (ref 22–32)
Calcium: 9.2 mg/dL (ref 8.9–10.3)
Chloride: 105 mmol/L (ref 98–111)
Creatinine, Ser: 0.6 mg/dL (ref 0.44–1.00)
GFR, Estimated: >60 mL/min (ref >60)
Glucose, Bld: 112 mg/dL — ABNORMAL HIGH (ref 70–99)
Potassium: 3.4 mmol/L — ABNORMAL LOW (ref 3.5–5.1)
Sodium: 138 mmol/L (ref 135–145)

## 2022-01-03 LAB — RESP PANEL BY RT-PCR (FLU A&B, COVID) ARPGX2
Influenza A by PCR: NEGATIVE
Influenza B by PCR: NEGATIVE
SARS Coronavirus 2 by RT PCR: NEGATIVE

## 2022-01-03 LAB — HCG, QUANTITATIVE, PREGNANCY: hCG, Beta Chain, Quant, S: <1 m[IU]/mL (ref <5)

## 2022-01-03 MED ORDER — HEPARIN SODIUM (PORCINE) 5000 UNIT/ML IJ SOLN
5000.0000 [IU] | Freq: Three times a day (TID) | INTRAMUSCULAR | Status: DC
  Administered 2022-01-04: 5000 [IU] via SUBCUTANEOUS
  Filled 2022-01-03 (×2): qty 1

## 2022-01-03 MED ORDER — SODIUM CHLORIDE 0.9 % IV SOLN: INTRAVENOUS | Status: DC

## 2022-01-03 MED ORDER — FAMOTIDINE 20 MG PO TABS
20.0000 mg | ORAL_TABLET | Freq: Two times a day (BID) | ORAL | Status: DC
  Administered 2022-01-04: 20 mg via ORAL
  Filled 2022-01-03: qty 1

## 2022-01-03 MED ORDER — SODIUM CHLORIDE 0.9% FLUSH
3.0000 mL | Freq: Two times a day (BID) | INTRAVENOUS | Status: DC
  Administered 2022-01-04: 3 mL via INTRAVENOUS

## 2022-01-03 NOTE — H&P (Signed)
History and Physical    Virginia Ochoa TMA:[id number] DOB: Nov 02, 2001 DOA: 01/03/2022  PCP: Perrin Maltese, MD    Patient coming from:  Home    Chief Complaint:  Syncope/ collapse with LOC  x 1 year intermittently.    HPI:  Virginia Liddell is a 21 y.o. female seen in ed with complaints of syncope and collapse with LOC. Since jan 2022: 1 year.  Dizzy/ nausea/headache / and she is unconscious . Pt is unconscious for an hour or hour an half.  Pt has come to hospital for syncope episode about 13 times. Lo loestrin- for OCP as well for one year.  Pt reports heavy period and heavy cramps.  Nosebleeds. No hematuria. No surgeries or difficulty with healing.  First time she got nosebleeds off and on has cauterization  3 years ago.  Recently has had few times a week and wakes up with nosebleed. Periods started at 15, Heavy since the age of 78. Syncope unrelated to periods and they  Last 5 days - ADVISED TO AVOID NSAID's The first 3 days are heavy and last two are scant.  Pt has seen cardiologist and was told it was not her heart. Pt went to neurology and was told to eeg and MRI. Pt has had covid last year.  Pt has ovarian cyst on usg done for her heavy periods.  Last trip was to [place] that she goes frequently for visiting her family but no swelling.  Lamonte Sakai been on midodrine inpatient as well.    Pt has past medical history of syncope and collapse,nosebleeds, menorrhagia, dizziness.  ED Course:   Vitals:   01/03/22 2059 01/03/22 2100 01/03/22 2222  BP: 115/75  108/70  Pulse: 89  88  Resp: 16  18  Temp: (!) 97.5 F (36.4 C)  98 F (36.7 C)  TempSrc: Axillary  Oral  SpO2: 100%  100%  Weight:  47.6 kg   Height:  _0  (1.676 m)   In ed pt gives history and afebrile normotensive. Initial EKG shows sinus rhythm 93 otherwise normal. Initial blood pressure BMP with a potassium of 3.4 glucose of 11. Initial CBC is within normal limits.   Review of Systems:  Review of Systems   HENT:  Positive for nosebleeds.   Gastrointestinal:  Positive for nausea.  Neurological:  Positive for dizziness and headaches.    Past Medical History:  Diagnosis Date   Known health problems: none 01/05/2019   Ovarian cyst     Past Surgical History:  Procedure Laterality Date   none       reports that she has never smoked. She has never used smokeless tobacco. She reports that she does not drink alcohol and does not use drugs.  No Known Allergies  Family History  Adopted: Yes  Family history unknown: Yes    Prior to Admission medications   Medication Sig Start Date End Date Taking? Authorizing Provider  ferrous sulfate 325 (65 FE) MG EC tablet Take 325 mg by mouth 3 (three) times daily with meals.    [provider]  midodrine (PROAMATINE) 2.5 MG tablet Take 2.5 mg by mouth 3 (three) times daily. 10/22/21   [provider]  norethindrone-ethinyl estradiol 1/35 (Julian 1/35, 28,) tablet Take 1 tablet by mouth daily. 02/12/21   Philip Aspen, CNM  vitamin C (ASCORBIC ACID) 500 MG tablet Take 500 mg by mouth daily.    Provider, CBC is within normal limits., MD    Physical Exam: Vitals:  01/03/22 2059 01/03/22 2100 01/03/22 2222  BP: 115/75  108/70  Pulse: 89  88  Resp: 16  18  Temp: (!) 97.5 F (36.4 C)  98 F (36.7 C)  TempSrc: Axillary  Oral  SpO2: 100%  100%  Weight:  47.6 kg   Height:  _0  (1.676 m)    Physical Exam Vitals reviewed.  Constitutional:      General: She is not in acute distress.    Appearance: Normal appearance. She is not ill-appearing.  HENT:     Head: Normocephalic and atraumatic.     Right Ear: Tympanic membrane, ear canal and external ear normal. There is no impacted cerumen.     Left Ear: Tympanic membrane, ear canal and external ear normal. There is no impacted cerumen.     Nose: Rhinorrhea present. No congestion.     Mouth/Throat:     Mouth: Mucous membranes are moist.  Eyes:     Extraocular Movements:  Extraocular movements intact.     Pupils: Pupils are equal, round, and reactive to light.  Neck:     Vascular: No carotid bruit.  Cardiovascular:     Rate and Rhythm: Normal rate and regular rhythm.     Pulses: Normal pulses.     Heart sounds: Normal heart sounds.  Pulmonary:     Effort: Pulmonary effort is normal.     Breath sounds: Normal breath sounds.  Abdominal:     General: Bowel sounds are normal. There is no distension.     Palpations: Abdomen is soft. There is mass.     Tenderness: There is no abdominal tenderness. There is no guarding.     Hernia: A hernia is present.  Musculoskeletal:     Right lower leg: No edema.     Left lower leg: No edema.  Skin:    General: Skin is warm.  Neurological:     General: No focal deficit present.     Mental Status: She is oriented to person, place, and time.     Cranial Nerves: No cranial nerve deficit.  Psychiatric:        Mood and Affect: Mood normal.        Behavior: Behavior normal.     Labs on Admission: I have personally reviewed following labs and imaging studies  No results for input(s): CKTOTAL, CKMB, TROPONINI in the last 72 hours. Lab Results  Component Value Date   WBC 8.0 01/03/2022   HGB 13.0 01/03/2022   HCT 38.1 01/03/2022   MCV 90.7 01/03/2022   PLT 310 01/03/2022    Recent Labs  Lab 01/03/22 2113  NA 138  K 3.4*  CL 105  CO2 26  BUN 11  CREATININE 0.60  CALCIUM 9.2  GLUCOSE 112*   No results found for: CHOL, HDL, LDLCALC, TRIG Lab Results  Component Value Date   DDIMER <0.27 03/11/2021   Invalid input(s): POCBNP   COVID-19 Labs No results for input(s): DDIMER, FERRITIN, LDH, CRP in the last 72 hours. Lab Results  Component Value Date   [place] NEGATIVE 01/03/2022    Radiological Exams on Admission: No results found.  EKG: Independently reviewed.  Sinus rhythm 93 otherwise normal.   Assessment/Plan: Principal Problem:   Syncope and collapse Active Problems:   Dizziness    Frequent nosebleeds   Headache   Syncope/collapse/dizziness: Patient has been having intermittent episodes in the past year he notices that dropping episodes have gotten worse in the past few days. Will admit patient to  med telemetry unit), neurologist has seen the patient and recommends EEG and MRI.dimer.  Orthostatic blood pressure. Body weight work-up with ANA, ESR, mono, BR, vitamin B12, Lyme disease. Urinalysis.  Frequent nosebleeds: Will obtain a urinalysis. Monitor for nosebleeds and ENT consult if necessary.  Headache: Patient also reports headaches during these episodes of passing out.  No incontinence no seizing most of the times her headache goes away.  DVT prophylaxis:  Heparin  Code Status:  Full code  Family Communication:  Marney,william (Father)  [phone] (Home Phone)  Disposition Plan:  Home    Consults called:  Neurology- by edmd.    Admission status: inpatient    Para Skeans MD [facility]  6 PM- 2 AM. Please contact me via secure Chat 6 PM-2 AM. How to contact the [facility] Attending or Consulting provider [place] or covering provider during after hours [place], for this patient.   Check the care team in [facility] and look for a) attending/consulting [facility] provider listed and b) the [facility] team listed Log into [email or web address] and use [place]'s universal password to access. If you do not have the password, please contact the hospital operator. Locate the [facility] provider you are looking for under [facility] and page to a number that you can be directly reached. If you still have difficulty reaching the provider, please page the [facility] (Director on Call) for the Hospitalists listed on amion for assistance. [email or web address] 01/04/2022, 12:37 AM

## 2022-01-03 NOTE — ED Provider Triage Note (Signed)
Emergency Medicine Provider Triage Evaluation Note  Virginia Ochoa, a 21 y.o. female  was evaluated in triage.  Pt complains of syncope.  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  During the video, reviewed by me, the patient appears to brace her self as she lions in the prone position, lowering her head quickly and slowly to the ground.  EMS was called out, and presents with the patient to the ED for evaluation.  Patient remains unresponsive to verbal commands.  She is lying with c-collar in place eyes closed.   Review of Systems  Positive: syncope Negative: Seizure activity  Physical Exam  BP 115/75 (BP Location: Right Arm)    Pulse 89    Temp (!) 97.5 F (36.4 C) (Axillary)    Resp 16    Ht 5\' 6"  (1.676 m)    Wt 47.6 kg    SpO2 100%    BMI 16.95 kg/m  Gen:   Awake, no distress  VSS Resp:  Normal effort CTA MSK:   Moves extremities without difficulty  Other:  CVS: RRR ENT:   Patient able to avert eyes downward during exam with light.   Medical Decision Making  Medically screening exam initiated at 9:13 PM.  Appropriate orders placed.  Virginia Ochoa was informed that the remainder of the evaluation will be completed by another provider, this initial triage assessment does not replace that evaluation, and the importance of remaining in the ED until their evaluation is complete.  Female patient ED evaluation of a syncopal episode while at work.  Patient presents via EMS with c-collar in place.  She is stable at this time but remains unresponsive to verbal stimuli.   Melvenia Needles, PA-C 01/03/22 2128

## 2022-01-03 NOTE — ED Notes (Signed)
Preg POCT negative

## 2022-01-03 NOTE — Discharge Instructions (Signed)
Until we know for sure what these episodes are and how to control them, you should take the following care to prevent injuring yourself or others.   Follow up with your doctor in 1-3 days.  During a seizure, a person may injure himself or herself. Seizure precautions are guidelines that a person can follow in order to minimize injury during a seizure. For any activity, it is important to ask, "What would happen if I had a seizure while doing this?" Follow the below precautions.   Bathroom Safety  A person with seizures may want to shower instead of bathe to avoid accidental drowning. If falls occur during the patient's typical seizure, a person should use a shower seat, preferably one with a safety strap.  Use nonskid strips in your shower or tub.  Never use electrical equipment near water. This prevents accidental electrocution.  Consider changing glass in shower doors to shatterproof glass.  Risk analyst If possible, cook when someone else is nearby.  Use the back burners of the stove to prevent accidental burns.  Use shatterproof containers as much as possible. For instance, sauces can be transferred from glass bottles to plastic containers for use.  Limit time that is required using knives or other sharp objects. If possible, buy foods that are already cut, or ask someone to help in meal preparation.   General Safety at [place] not smoke or light fires in the fireplace unless someone else is present.  Do not use space heaters that can be accidentally overturned.  When alone, avoid using step stools or ladders, and do not clean rooftop gutters.  Purchase power tools and motorized Company secretary which have a safety switch that will stop the machine if you release the handle (a 'dead man's' switch).   Driving and Transportation DO NOT [place] and/or you have permission to drive from your state's Department of Motor Vehicles  [facility]). Each state has  different laws. Please refer to the following link on the [place] website for more information: http://www.epilepsyfoundation.org/answerplace/Social/driving/drivingu.cfm  If you ride a bicycle, wear a helmet and any other necessary protective gear.  When taking public transportation like the bus or subway, stay clear of the platform edge.   Outdoor Insurance underwriter is okay, but does present certain risks. Never swim alone, and tell friends what to do if you have a seizure while swimming.  Wear appropriate protective equipment.  Ski with a friend. If a seizure occurs, your friend can seek help, if needed. He or she can also help to get you out of the cold. Consider using a safety hook or belt while riding the ski lift.

## 2022-01-03 NOTE — ED Provider Notes (Signed)
[facility] Provider Note    Event Date/Time   First MD Initiated Contact with Patient 01/03/22 2131     (approximate)   History   Fall   HPI  [place] Ballinger is a 21 y.o. female  who, per cardiology note 11.30.22 has history of multiple syncopal episodes with prolonged episode of unconsciousness, presents to the emergency department today because of concern for a syncopal type episode.  Patient was seen in the emergency department yesterday.  She has had these episodes frequently where she will become unresponsive.  These can last for up to 1 hour.  Earlier today she saw a neurologist.  She is scheduled for an EEG in a few weeks.  She said that today's episode was somewhat different and that she did not feel any syndromes prior to passing out.  She denies any significant pain after falling.      Physical Exam   Triage Vital Signs: ED Triage Vitals  Enc Vitals Group     BP 01/03/22 2059 115/75     Pulse Rate 01/03/22 2059 89     Resp 01/03/22 2059 16     Temp 01/03/22 2059 (!) 97.5 F (36.4 C)     Temp Source 01/03/22 2059 Axillary     SpO2 01/03/22 2059 100 %     Weight 01/03/22 2100 105 lb (47.6 kg)     Height 01/03/22 2100 5\' 6"  (1.676 m)   Most recent vital signs: Vitals:   01/03/22 2059  BP: 115/75  Pulse: 89  Resp: 16  Temp: (!) 97.5 F (36.4 C)  SpO2: 100%    General: Awake, no distress.  CV:  Good peripheral perfusion.  Resp:  Normal effort.  Abd:  No distention.  MSK:  No midline spinal tenderness. No deformities.   ED Results / Procedures / Treatments   Labs (all labs ordered are listed, but only abnormal results are displayed) Labs Reviewed  BASIC METABOLIC PANEL - Abnormal; Notable for the following components:      Result Value   Potassium 3.4 (*)    Glucose, Bld 112 (*)    All other components within normal limits  RESP PANEL BY RT-PCR (FLU A&B, COVID) ARPGX2  CBC WITH DIFFERENTIAL/PLATELET      EKG  None   RADIOLOGY None    PROCEDURES:  Critical Care performed: No  Procedures   MEDICATIONS ORDERED IN ED: Medications - No data to display   IMPRESSION / MDM / ASSESSMENT AND PLAN / ED COURSE  I reviewed the triage vital signs and the nursing notes.                              Differential diagnosis includes, but is not limited to, vasovagal syncope, anemia, arrhythmia.   Patient presented to the emergency department today because of concerns for a another episode of syncope and decreased responsiveness.  The time my exam patient is completely awake and alert.  No evidence of any significant traumatic injuries from the fall.  I did have a discussion with the patient.  These events are happening frequently and there was a change in the nature of the event today.  Discussed the case with the hospitalist.  Patient will be admitted to the hospital service.     FINAL CLINICAL IMPRESSION(S) / ED DIAGNOSES   Final diagnoses:  Fall, initial encounter  Syncope, unspecified syncope type      Note:  This document was prepared using Dragon voice recognition software and may include unintentional dictation errors. Phineas Semen, MD 01/03/22 [phone]

## 2022-01-03 NOTE — ED Triage Notes (Addendum)
Pt to ED via [facility].  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  Per video from EMS pt was able to brace herself and did not sustain an injury.  Pt laying flat, not responding to stimuli. Pt seen for same last night. Pt seen at neurology for same today.  Pt's father with pt. Father states that pt is scheduled for EEG.  Father states pt has been eating and drinking okay, did not have any episodes while out of work for the holiday but has had two since returning to work two days ago. Pt has c-collar in place.

## 2022-01-03 NOTE — ED Notes (Signed)
Pt alert, oriented, speaking with family at bedside. Pt to MRI via wheelchair by rad tech.

## 2022-01-04 ENCOUNTER — Inpatient Hospital Stay (HOSPITAL_BASED_OUTPATIENT_CLINIC_OR_DEPARTMENT_OTHER)
Admit: 2022-01-04 | Discharge: 2022-01-04 | Disposition: A | Discharge status: Home / Self Care | Payer: BC Managed Care – PPO | Attending: Internal Medicine | Admitting: Internal Medicine

## 2022-01-04 DIAGNOSIS — R55 Syncope and collapse: Secondary | ICD-10-CM

## 2022-01-04 LAB — MONONUCLEOSIS SCREEN: Mono Screen: NEGATIVE

## 2022-01-04 LAB — HEPATITIS PANEL, ACUTE
HCV Ab: NONREACTIVE
Hep A IgM: NONREACTIVE
Hep B C IgM: NONREACTIVE
Hepatitis B Surface Ag: NONREACTIVE

## 2022-01-04 LAB — D-DIMER, QUANTITATIVE: D-Dimer, Quant: 0.4 ug/mL-FEU (ref 0.00–0.50)

## 2022-01-04 LAB — PROTIME-INR
INR: 1.1 (ref 0.8–1.2)
Prothrombin Time: 14.2 seconds (ref 11.4–15.2)

## 2022-01-04 LAB — TSH: TSH: 1.526 u[IU]/mL (ref 0.350–4.500)

## 2022-01-04 LAB — IRON AND TIBC
Iron: 94 ug/dL (ref 28–170)
Saturation Ratios: 25 % (ref 10.4–31.8)
TIBC: 375 ug/dL (ref 250–450)
UIBC: 281 ug/dL

## 2022-01-04 LAB — RETICULOCYTES
Immature Retic Fract: 5.1 % (ref 2.3–15.9)
RBC.: 3.98 MIL/uL (ref 3.87–5.11)
Retic Count, Absolute: 54.9 10*3/uL (ref 19.0–186.0)
Retic Ct Pct: 1.4 % (ref 0.4–3.1)

## 2022-01-04 LAB — T4, FREE: Free T4: 0.92 ng/dL (ref 0.61–1.12)

## 2022-01-04 LAB — TYPE AND SCREEN
ABO/RH(D): O NEG
Antibody Screen: NEGATIVE

## 2022-01-04 LAB — ECHOCARDIOGRAM COMPLETE
AR max vel: 1.92 cm2
AV Area VTI: 2.13 cm2
AV Area mean vel: 1.73 cm2
AV Mean grad: 2 mmHg
AV Peak grad: 3.4 mmHg
Ao pk vel: 0.92 m/s
Area-P 1/2: 4.08 cm2
Height: 66 in
S' Lateral: 2.9 cm
Weight: 1680 oz

## 2022-01-04 LAB — HIV ANTIBODY (ROUTINE TESTING W REFLEX): HIV Screen 4th Generation wRfx: NONREACTIVE

## 2022-01-04 LAB — CBG MONITORING, ED: Glucose-Capillary: 89 mg/dL (ref 70–99)

## 2022-01-04 LAB — FOLATE: Folate: 13.6 ng/mL (ref >5.9)

## 2022-01-04 LAB — APTT: aPTT: 36 seconds (ref 24–36)

## 2022-01-04 LAB — FERRITIN: Ferritin: 12 ng/mL (ref 11–307)

## 2022-01-04 LAB — RPR: RPR Ser Ql: NONREACTIVE

## 2022-01-04 LAB — VITAMIN B12: Vitamin B-12: 550 pg/mL (ref 180–914)

## 2022-01-04 NOTE — Progress Notes (Signed)
Eeg done 

## 2022-01-04 NOTE — Consult Note (Signed)
Neurology Consultation Reason for Consult: Episodes of loss of consciousness Referring Physician: Fran Lowes, T  CC: Episodes of loss of consciousness  History is obtained from: Patient  HPI: Virginia Ochoa is a 21 y.o. female who has been having episodes of loss of consciousness for approximately a year.  At the beginning, she would simply pass out and then appeared asleep for 5 to 10 minutes.  Over the course of the year, this has gotten progressively longer.  She describes that she would feel lightheaded and feel her "heart racing."  She would then know that she needed to get down and then she would just go out.  She initially had no type of postictal state.  Over the course of the year, they have become longer, sometimes lasting up to an hour to an hour and a half.  She has been evaluated by cardiology who do not feel there is a cardiac etiology, and Holter monitor has been negative.  She was therefore referred to neurology for evaluation of possible seizures.  The night before her first neurology visit, she had an episode of shaking.  It initially started similar to her previous episodes, but then while she was out, she had an episode of generalized shaking that lasted 2 to 3 minutes.  Her eyes were closed, there is no tongue biting or incontinence associated with it.  She was then evaluated by neurology yesterday who referred her for an EEG and MRI to be done as an outpatient.  That evening, after work, she had another episode with shaking.  This prompted her to come back into the emergency department and she has been admitted for further evaluation of the spells.  She does not know of any particular instigating factors, other than it seems to occur more often when she is tired, often in the evening.   ROS: A 14 point ROS was performed and is negative except as noted in the HPI.   Past Medical History:  Diagnosis Date   Known health problems: none 01/05/2019   Ovarian cyst      Family History   Adopted: Yes  Family history unknown: Yes     Social History:  reports that she has never smoked. She has never used smokeless tobacco. She reports that she does not drink alcohol and does not use drugs.   Exam: Current vital signs: BP (!) 87/68    Pulse 98    Temp 98 F (36.7 C) (Oral)    Resp 16    Ht 5\' 6"  (1.676 m)    Wt 47.6 kg    SpO2 100%    BMI 16.95 kg/m  Vital signs in last 24 hours: Temp:  [97.5 F (36.4 C)-98 F (36.7 C)] 98 F (36.7 C) (01/05 2222) Pulse Rate:  [66-100] 98 (01/06 1330) Resp:  [10-21] 16 (01/06 1330) BP: (87-115)/(49-75) 87/68 (01/06 1330) SpO2:  [97 %-100 %] 100 % (01/06 1330) Weight:  [47.6 kg] 47.6 kg (01/05 2100)   Physical Exam  Constitutional: Appears well-developed and well-nourished.  Psych: Affect appropriate to situation Eyes: No scleral injection HENT: No OP obstruction MSK: no joint deformities.  Cardiovascular: Normal rate and regular rhythm.  Respiratory: Effort normal, non-labored breathing GI: Soft.  No distension. There is no tenderness.  Skin: WDI  Neuro: Mental Status: Patient is awake, alert, oriented to person, place, month, year, and situation. Patient is able to give a clear and coherent history. No signs of aphasia or neglect Cranial Nerves: II: Visual Fields are  full. Pupils are equal, round, and reactive to light.   III,IV, VI: EOMI without ptosis or diploplia.  V: Facial sensation is symmetric to temperature VII: Facial movement is symmetric.  VIII: hearing is intact to voice X: Uvula elevates symmetrically XI: Shoulder shrug is symmetric. XII: tongue is midline without atrophy or fasciculations.  Motor: Tone is normal. Bulk is normal. 5/5 strength was present in all four extremities.  Sensory: Sensation is symmetric to light touch and temperature in the arms and legs. Deep Tendon Reflexes: 2+ and symmetric in the biceps and patellae.  Cerebellar: FNF  intact bilaterally    I have reviewed labs in  epic and the results pertinent to this consultation are: BMP-borderline potassium, otherwise unremarkable  I have reviewed the images obtained: MRI brain-normal  Impression: 21 year old female with recurrent episodes of loss of consciousness, now as she is being referred to neurology for possible evaluation of seizures she has developed shaking which could be consistent with seizure.  The description of the events would be highly unusual for seizure, or for cardiac syncope for that matter.  I do have concern for possible nonorganic etiology, but further evaluation is currently needed.  She will need an EEG, which can be done this afternoon.  If this is negative, then I would favor prolonged outpatient monitoring to try and capture one of these episodes.  Recommendations: 1) EEG 2) if EEG is negative, would consider prolonged EEG to be done as an outpatient.   Ritta Slot, MD [facility] [phone]  If 7pm- 7am, please page neurology on call as listed in AMION.

## 2022-01-04 NOTE — Procedures (Signed)
History: 21 year old female with a history of syncope.  Sedation: None  Technique: This EEG was acquired with electrodes placed according to the International 10-20 electrode system (including Fp1, Fp2, F3, F4, C3, C4, P3, P4, O1, O2, T3, T4, T5, T6, A1, A2, Fz, Cz, Pz). The following electrodes were missing or displaced: none.   Background: The background consists of intermixed alpha and beta activities. There is a well defined posterior dominant rhythm of 9-10 hz that attenuates with eye opening.  With drowsiness there is anterior shifting of the posterior dominant rhythm, but sleep is not recorded.  Photic stimulation: Physiologic driving is present  EEG Abnormalities: None  Clinical Interpretation: This normal EEG is recorded in the waking and drowsy state. There was no seizure or seizure predisposition recorded on this study. Please note that lack of epileptiform activity on EEG does not preclude the possibility of epilepsy.   Ritta Slot, MD [facility] [phone]  If 7pm- 7am, please page neurology on call as listed in AMION.

## 2022-01-04 NOTE — Discharge Summary (Signed)
Physician Discharge Summary   Virginia Ochoa  female DOB: [birth year]  ZHG:[id number]  PCP: Margaretann Loveless, MD  Admit date: 01/03/2022 Discharge date: 01/04/2022  Admitted From: home Disposition:  home CODE STATUS: Full code  Discharge Instructions     Discharge instructions   Complete by: As directed    All workup still negative for why you faint.  MRI brain normal.  EEG normal.  Neurology Dr. Amada Jupiter cleared you to go home and follow up with your outpatient neurologist, who has scheduled you for prolonged EEG monitoring.  There is also a possibility that your fainting spells are psychogenic. [facility] Course:  For full details, please see H&P, progress notes, consult notes and ancillary notes.  Briefly,  Virginia Schear is a 21 y.o. female with no significant PMH who presented with complaints of syncope and collapse with LOC.  Recurrent episodes since Jan 2022, reportedly pt could be unconscious for an hour or hour an half.  Pt has had multiple hospital presentations for this and is currently seeing outpatient Neurology Dr. Malvin Johns.  Pt presented to the ED after her last 2 episodes were associated with shaking,   Syncope and collapse  --Unclear etiology. --She has been evaluated by cardiology who do not feel there is a cardiac etiology, and Holter monitor has been negative.   --During current admission, MRI brain normal.  EEG normal.  Neurology Dr. Amada Jupiter cleared pt to go home and follow up with outpatient neurologist, who has scheduled pt for prolonged EEG monitoring.  Per neuro, there is also a possibility that the fainting spells are psychogenic.   Discharge Diagnoses:  Principal Problem:   Syncope and collapse Active Problems:   Dizziness   Frequent nosebleeds   Headache   30 Day Unplanned Readmission Risk Score    Flowsheet Row ED from 01/03/2022 in [facility]PART[facility]  30 Day Unplanned Readmission Risk Score (%)  10.63 Filed at 01/04/2022 1600       This score is the patient's risk of an unplanned readmission within 30 days of being discharged (0 -100%). The score is based on dignosis, age, lab data, medications, orders, and past utilization.   Low:  0-14.9   Medium: 15-21.9   High: 22-29.9   Extreme: 30 and above         Discharge Instructions:  Allergies as of 01/04/2022   No Known Allergies      Medication List     STOP taking these medications    ferrous sulfate 325 (65 FE) MG EC tablet   midodrine 2.5 MG tablet Commonly known as: PROAMATINE   vitamin C 500 MG tablet Commonly known as: ASCORBIC ACID       TAKE these medications    Ortho-Novum 1/35 (28) tablet Generic drug: norethindrone-ethinyl estradiol 1/35 Take 1 tablet by mouth daily.         Follow-up Information     Morene Crocker, MD Follow up.   Specialty: Neurology Why: for prolonged EEG and fainting spells. Contact information: [address] [facility] [place] [address] [phone]                 No Known Allergies   The results of significant diagnostics from this hospitalization (including imaging, microbiology, ancillary and laboratory) are listed below for reference.   Consultations:   Procedures/Studies: MR BRAIN WO CONTRAST  Result Date: 01/04/2022 CLINICAL DATA:  Initial evaluation for  acute syncope. EXAM: MRI HEAD WITHOUT CONTRAST TECHNIQUE: Multiplanar, multiecho pulse sequences of the brain and surrounding structures were obtained without intravenous contrast. COMPARISON:  CT from 11/20/2021. FINDINGS: Brain: Cerebral volume within normal limits for patient age. No focal parenchymal signal abnormality identified. No abnormal foci of restricted diffusion to suggest acute or subacute ischemia. Gray-white matter differentiation well maintained. No encephalomalacia to suggest chronic infarction. No foci of susceptibility artifact to suggest acute  or chronic intracranial hemorrhage. No mass lesion, midline shift or mass effect. No hydrocephalus. No extra-axial fluid collection. Pituitary gland and suprasellar region are normal. Midline structures intact and normal. Intact no intrinsic temporal lobe abnormality. Vascular: Major intracranial vascular flow voids well maintained and normal in appearance. Skull and upper cervical spine: Craniocervical junction normal. Visualized upper cervical spine within normal limits. Bone marrow signal intensity normal. No scalp soft tissue abnormality. Sinuses/Orbits: Globes and orbital soft tissues within normal limits. Paranasal sinuses are clear. No mastoid effusion. Inner ear structures grossly within normal limits. Other: None. IMPRESSION: Normal brain MRI. No acute intracranial abnormality identified. Electronically Signed   By: Rise Mu M.D.   On: 01/04/2022 03:02   EEG adult  Result Date: 01/04/2022 Rejeana Brock, MD     01/04/2022  3:54 PM History: 21 year old female with a history of syncope. Sedation: None Technique: This EEG was acquired with electrodes placed according to the International 10-20 electrode system (including Fp1, Fp2, F3, F4, C3, C4, P3, P4, O1, O2, T3, T4, T5, T6, A1, A2, Fz, Cz, Pz). The following electrodes were missing or displaced: none. Background: The background consists of intermixed alpha and beta activities. There is a well defined posterior dominant rhythm of 9-10 hz that attenuates with eye opening.  With drowsiness there is anterior shifting of the posterior dominant rhythm, but sleep is not recorded. Photic stimulation: Physiologic driving is present EEG Abnormalities: None Clinical Interpretation: This normal EEG is recorded in the waking and drowsy state. There was no seizure or seizure predisposition recorded on this study. Please note that lack of epileptiform activity on EEG does not preclude the possibility of epilepsy. Ritta Slot, MD [facility] [phone] If 7pm- 7am, please page neurology on call as listed in [facility].      Labs: BNP (last 3 results) No results for input(s): BNP in the last 8760 hours. Basic Metabolic Panel: Recent Labs  Lab 01/02/22 2248 01/03/22 2113  NA 137 138  K 3.5 3.4*  CL 102 105  CO2 26 26  GLUCOSE 93 112*  BUN 15 11  CREATININE 0.72 0.60  CALCIUM 9.2 9.2   Liver Function Tests: No results for input(s): AST, ALT, ALKPHOS, BILITOT, PROT, ALBUMIN in the last 168 hours. No results for input(s): LIPASE, AMYLASE in the last 168 hours. No results for input(s): AMMONIA in the last 168 hours. CBC: Recent Labs  Lab 01/02/22 2248 01/03/22 2113  WBC 8.1 8.0  NEUTROABS 5.6 5.2  HGB 13.0 13.0  HCT 37.1 38.1  MCV 90.0 90.7  PLT 284 310   Cardiac Enzymes: No results for input(s): CKTOTAL, CKMB, CKMBINDEX, TROPONINI in the last 168 hours. BNP: Invalid input(s): POCBNP CBG: Recent Labs  Lab 01/04/22 0803  GLUCAP 89   D-Dimer Recent Labs    01/04/22 0040  DDIMER 0.40   Hgb A1c No results for input(s): HGBA1C in the last 72 hours. Lipid Profile No results for input(s): CHOL, HDL, LDLCALC, TRIG, CHOLHDL, LDLDIRECT in the last 72 hours. Thyroid function studies Recent Labs  01/04/22 0040  TSH 1.526   Anemia work up Recent Labs    01/04/22 0040  VITAMINB12 550  FOLATE 13.6  FERRITIN 12  TIBC 375  IRON 94  RETICCTPCT 1.4   Urinalysis    Component Value Date/Time   COLORURINE YELLOW 01/02/2022 2248   APPEARANCEUR CLEAR 01/02/2022 2248   LABSPEC 1.015 01/02/2022 2248   PHURINE 6.0 01/02/2022 2248   GLUCOSEU NEGATIVE 01/02/2022 2248   HGBUR NEGATIVE 01/02/2022 2248   BILIRUBINUR NEGATIVE 01/02/2022 2248   KETONESUR NEGATIVE 01/02/2022 2248   PROTEINUR NEGATIVE 01/02/2022 2248   NITRITE NEGATIVE 01/02/2022 2248   LEUKOCYTESUR NEGATIVE 01/02/2022 2248   Sepsis Labs Invalid input(s): PROCALCITONIN,  WBC,  LACTICIDVEN Microbiology Recent Results  (from the past 240 hour(s))  Resp Panel by RT-PCR (Flu A&B, Covid) Nasopharyngeal Swab     Status: None   Collection Time: 01/03/22 10:20 PM   Specimen: Nasopharyngeal Swab; Nasopharyngeal(NP) swabs in vial transport medium  Result Value Ref Range Status   SARS Coronavirus 2 by RT PCR NEGATIVE NEGATIVE Final    Comment: (NOTE) SARS-CoV-2 target nucleic acids are NOT DETECTED.  The SARS-CoV-2 RNA is generally detectable in upper respiratory specimens during the acute phase of infection. The lowest concentration of SARS-CoV-2 viral copies this assay can detect is 138 copies/mL. A negative result does not preclude SARS-Cov-2 infection and should not be used as the sole basis for treatment or other patient management decisions. A negative result may occur with  improper specimen collection/handling, submission of specimen other than nasopharyngeal swab, presence of viral mutation(s) within the areas targeted by this assay, and inadequate number of viral copies(<138 copies/mL). A negative result must be combined with clinical observations, patient history, and epidemiological information. The expected result is Negative.  Fact Sheet for Patients:  BloggerCourse.comhttps://www.fda.gov/media/152166/download  Fact Sheet for Healthcare Providers:  SeriousBroker.ithttps://www.fda.gov/media/152162/download  This test is no t yet approved or cleared by the [place] FDA and  has been authorized for detection and/or diagnosis of SARS-CoV-2 by FDA under an Emergency Use Authorization (EUA). This EUA will remain  in effect (meaning this test can be used) for the duration of the COVID-19 declaration under Section 564(b)(1) of the Act, 21 U.S.C.section 360bbb-3(b)(1), unless the authorization is terminated  or revoked sooner.       Influenza A by PCR NEGATIVE NEGATIVE Final   Influenza B by PCR NEGATIVE NEGATIVE Final    Comment: (NOTE) The Xpert Xpress SARS-CoV-2/FLU/RSV plus assay is intended as an aid in the  diagnosis of influenza from Nasopharyngeal swab specimens and should not be used as a sole basis for treatment. Nasal washings and aspirates are unacceptable for Xpert Xpress SARS-CoV-2/FLU/RSV testing.  Fact Sheet for Patients: BloggerCourse.comhttps://www.fda.gov/media/152166/download  Fact Sheet for Healthcare Providers: SeriousBroker.ithttps://www.fda.gov/media/152162/download  This test is not yet approved or cleared by the [place] FDA and has been authorized for detection and/or diagnosis of SARS-CoV-2 by FDA under an Emergency Use Authorization (EUA). This EUA will remain in effect (meaning this test can be used) for the duration of the COVID-19 declaration under Section 564(b)(1) of the Act, 21 U.S.C. section 360bbb-3(b)(1), unless the authorization is terminated or revoked.  Performed at [facility], [address] [place], [place] [id number]      Total time spend on discharging this patient, including the last patient exam, discussing the hospital stay, instructions for ongoing care as it relates to all pertinent caregivers, as well as preparing the medical discharge records, prescriptions, and/or referrals as applicable, is 40  minutes.    Darlin Priestlyina Finley Chevez, MD  [facility] 01/04/2022, 4:17 PM

## 2022-01-04 NOTE — Progress Notes (Signed)
*  PRELIMINARY RESULTS* Echocardiogram 2D Echocardiogram has been performed.  Cristela Blue 01/04/2022, 2:25 PM

## 2022-01-05 ENCOUNTER — Other Ambulatory Visit: Payer: Self-pay | Admitting: Physician Assistant

## 2022-01-05 DIAGNOSIS — R200 Anesthesia of skin: Secondary | ICD-10-CM

## 2022-01-05 DIAGNOSIS — R519 Headache, unspecified: Secondary | ICD-10-CM

## 2022-01-05 DIAGNOSIS — R55 Syncope and collapse: Secondary | ICD-10-CM

## 2022-01-05 DIAGNOSIS — R4189 Other symptoms and signs involving cognitive functions and awareness: Secondary | ICD-10-CM

## 2022-01-07 ENCOUNTER — Ambulatory Visit: Payer: BC Managed Care – PPO | Admitting: Psychologist

## 2022-01-07 LAB — ANTINUCLEAR ANTIBODIES, IFA: ANA Ab, IFA: POSITIVE — AB

## 2022-01-07 LAB — FANA STAINING PATTERNS: Homogeneous Pattern: 1

## 2022-01-08 ENCOUNTER — Encounter: Payer: Self-pay | Admitting: Emergency Medicine

## 2022-01-08 ENCOUNTER — Other Ambulatory Visit: Payer: Self-pay

## 2022-01-08 ENCOUNTER — Emergency Department
Admission: EM | Admit: 2022-01-08 | Discharge: 2022-01-08 | Disposition: A | Discharge status: Home / Self Care | Payer: BC Managed Care – PPO | Attending: Emergency Medicine | Admitting: Emergency Medicine

## 2022-01-08 DIAGNOSIS — Z5321 Procedure and treatment not carried out due to patient leaving prior to being seen by health care provider: Secondary | ICD-10-CM | POA: Diagnosis not present

## 2022-01-08 DIAGNOSIS — R55 Syncope and collapse: Secondary | ICD-10-CM | POA: Diagnosis not present

## 2022-01-08 LAB — LYME DISEASE, WESTERN BLOT
IgG P18 Ab.: ABSENT
IgG P23 Ab.: ABSENT
IgG P28 Ab.: ABSENT
IgG P30 Ab.: ABSENT
IgG P45 Ab.: ABSENT
IgG P66 Ab.: ABSENT
IgG P93 Ab.: ABSENT
IgM P23 Ab.: ABSENT
IgM P39 Ab.: ABSENT
IgM P41 Ab.: ABSENT
Lyme IgG Wb: NEGATIVE
Lyme IgM Wb: NEGATIVE

## 2022-01-08 LAB — BASIC METABOLIC PANEL
Anion gap: 5 (ref 5–15)
BUN: 12 mg/dL (ref 6–20)
CO2: 26 mmol/L (ref 22–32)
Calcium: 9.3 mg/dL (ref 8.9–10.3)
Chloride: 105 mmol/L (ref 98–111)
Creatinine, Ser: 0.96 mg/dL (ref 0.44–1.00)
GFR, Estimated: >60 mL/min (ref >60)
Glucose, Bld: 101 mg/dL — ABNORMAL HIGH (ref 70–99)
Potassium: 3.8 mmol/L (ref 3.5–5.1)
Sodium: 136 mmol/L (ref 135–145)

## 2022-01-08 LAB — CBC
HCT: 37.9 % (ref 36.0–46.0)
Hemoglobin: 12.8 g/dL (ref 12.0–15.0)
MCH: 30.6 pg (ref 26.0–34.0)
MCHC: 33.8 g/dL (ref 30.0–36.0)
MCV: 90.7 fL (ref 80.0–100.0)
Platelets: 276 10*3/uL (ref 150–400)
RBC: 4.18 MIL/uL (ref 3.87–5.11)
RDW: 11.6 % (ref 11.5–15.5)
WBC: 6.4 10*3/uL (ref 4.0–10.5)
nRBC: 0 % (ref 0.0–0.2)

## 2022-01-08 LAB — TROPONIN I (HIGH SENSITIVITY): Troponin I (High Sensitivity): <2 ng/L (ref <18)

## 2022-01-08 NOTE — ED Triage Notes (Signed)
Recurrent syncopal episodes, recently admitted, no dx yet, but pt states she may have POTS but drs have not yet determined the cause of the syncopal episodes.  Pt is alert and oriented at this time.

## 2022-01-08 NOTE — ED Triage Notes (Signed)
Pt arrived via [facility] from home with reports of syncopal episode this evening, pt seen recently for the same complaint.  May have POTS but not dx yet.  EKG - 20G IV LAC  109/71 p-108 100% RA

## 2022-01-09 DIAGNOSIS — R569 Unspecified convulsions: Secondary | ICD-10-CM | POA: Insufficient documentation

## 2022-01-11 ENCOUNTER — Other Ambulatory Visit: Payer: Self-pay

## 2022-01-11 ENCOUNTER — Ambulatory Visit (INDEPENDENT_AMBULATORY_CARE_PROVIDER_SITE_OTHER): Payer: BC Managed Care – PPO | Admitting: Psychologist

## 2022-01-11 DIAGNOSIS — F451 Undifferentiated somatoform disorder: Secondary | ICD-10-CM | POA: Diagnosis not present

## 2022-01-11 NOTE — Progress Notes (Signed)
                Dantre Yearwood, PsyD 

## 2022-01-11 NOTE — Progress Notes (Signed)
[facility] Counselor Initial Adult Exam  Name: Virginia Ochoa Date: 01/11/2022 MRN: [id number] DOB: [birth year] PCP: Perrin Maltese, MD  Time spent: 10:00 am to 10:32 am; total time: 32 minutes  This session was held via in person. The patient consented to in-person therapy and was in the clinician's office. Limits of confidentiality were discussed with the patient.   Guardian/Payee:  NA    Paperwork requested: No   Reason for Visit /Presenting Problem: The patient is seeking out counseling due to experiencing syncope episodes. Per the patient, she stated that the symptoms started one year ago. Elaborating, patient stated that she experiences a headache or dizziness before passing out unconsciously. Per the patient, she indicated that she has been to the ED multiple times and an organic cause has not been identified at this time. She indicated that these periods of experiencing unconsciousness are becoming longer.   Mental Status Exam: Appearance:   Well Groomed     Behavior:  Appropriate  Motor:  Normal  Speech/Language:   Normal Rate  Affect:  Appropriate  Mood:  normal  Thought process:  normal  Thought content:    WNL  Sensory/Perceptual disturbances:    WNL  Orientation:  oriented to person, place, and time/date  Attention:  Good  Concentration:  Good  Memory:  WNL  Fund of knowledge:   Good  Insight:    Fair  Judgment:   Good  Impulse Control:  Good    Reported Symptoms:  The patient endorsed experiencing the following: periods of having a headache or being dizzy following by syncope. Per the patient, the episodes are becoming longer. The patient also stated that recently she started developing tremors while being unconscious. Patient denied being aware of experiencing anxiety or depressive based symptoms. Patient denied suicidal and homicidal ideation.   Risk Assessment: Danger to Self:  No Self-injurious Behavior: No Danger to Others: No Duty to  Warn:no Physical Aggression / Violence:No  Access to Firearms a concern: No  Gang Involvement:No  Patient / guardian was educated about steps to take if suicide or homicide risk level increases between visits: n/a While future psychiatric events cannot be accurately predicted, the patient does not currently require acute inpatient psychiatric care and does not currently meet [facility] involuntary commitment criteria.  Substance Abuse History: Current substance abuse: No     Past Psychiatric History:   No previous psychological problems have been observed Outpatient Providers:NA History of Psych Hospitalization: No  Psychological Testing:  NA    Abuse History:  Victim of: No.,  NA    Report needed: No. Victim of Neglect:No. Perpetrator of  NA   Witness / Exposure to Domestic Violence: No   Protective Services Involvement: No  Witness to [employer] Violence:  No   Family History:  Family History  Adopted: Yes  Family history unknown: Yes    Living situation: the patient lives with their family. Patient lives with her parents and younger sister.   Sexual Orientation: Straight  Relationship Status: Patient is currently dating.   Name of spouse / other:Boyfriend name is [name].  If a parent, number of children / ages:NA  Support Systems: Patient described parents as being supportive.   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   Military Service: No   Educational History: Education: Audiological scientist. Patient stated that she is one year into cosmetology school.   Religion/Sprituality/World View: Patient identified as Engineer, manufacturing.   Any cultural differences that  may affect / interfere with treatment:  NA  Recreation/Hobbies: Spending time with family  Stressors: Patient denied experiencing stressors that she is aware of  Strengths: Supportive Relationships, Family, Self Advocate, and Able to Communicate  Effectively  Barriers:  NA   Legal History: Pending legal issue / charges: The patient has no significant history of legal issues. History of legal issue / charges:  NA  Medical History/Surgical History: reviewed Past Medical History:  Diagnosis Date   Known health problems: none 01/05/2019   Ovarian cyst     Past Surgical History:  Procedure Laterality Date   none      Medications: Current Outpatient Medications  Medication Sig Dispense Refill   norethindrone-ethinyl estradiol 1/35 ([place] 1/35, 28,) tablet Take 1 tablet by mouth daily. 28 tablet 11   No current facility-administered medications for this visit.    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   Plan of Care: The patient is a 21 year old African American female who was referred due to experiencing syncope episodes that have not been found to have an organic cause at this time. The patient lives at home with her parents, younger sister, three dogs, and a cat. 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 Most recently patient has begun to experience tremors while being unconscious. Per the patient, the periods of being unconscious have become longer and longer. The patient denied suicidal and homicidal ideation. The patient stated that she is has met with a neurologist and is scheduled to have neurology exams done to determine if patient is experiencing a form of epilepsy. It is possible that the patient may be experiencing a conversion disorder and this needs to be ruled out. However, it would be important to rule out other possible organic causes of the syncope.   The patient stated that she wanted to follow up with neurology first and then follow up with psychology based off of results from  neurology.  It is the recommendation of this psychologist that patient follows up with neurology to rule out organic causes of syncope. It is possible that the patient would benefit from counseling services, however additional information is needed and additional rule outs need to occur before moving forward with counseling. The patient was agreeable to the plan discussed.    Conception Chancy, PsyD

## 2022-01-16 ENCOUNTER — Emergency Department
Admission: EM | Admit: 2022-01-16 | Discharge: 2022-01-16 | Disposition: A | Discharge status: Home / Self Care | Payer: BC Managed Care – PPO | Attending: Emergency Medicine | Admitting: Emergency Medicine

## 2022-01-16 ENCOUNTER — Other Ambulatory Visit: Payer: Self-pay

## 2022-01-16 DIAGNOSIS — Z5321 Procedure and treatment not carried out due to patient leaving prior to being seen by health care provider: Secondary | ICD-10-CM | POA: Insufficient documentation

## 2022-01-16 DIAGNOSIS — R569 Unspecified convulsions: Secondary | ICD-10-CM | POA: Diagnosis present

## 2022-01-16 LAB — BASIC METABOLIC PANEL
Anion gap: 7 (ref 5–15)
BUN: 10 mg/dL (ref 6–20)
CO2: 24 mmol/L (ref 22–32)
Calcium: 9 mg/dL (ref 8.9–10.3)
Chloride: 105 mmol/L (ref 98–111)
Creatinine, Ser: 0.89 mg/dL (ref 0.44–1.00)
GFR, Estimated: >60 mL/min (ref >60)
Glucose, Bld: 92 mg/dL (ref 70–99)
Potassium: 3.4 mmol/L — ABNORMAL LOW (ref 3.5–5.1)
Sodium: 136 mmol/L (ref 135–145)

## 2022-01-16 LAB — CBC
HCT: 38 % (ref 36.0–46.0)
Hemoglobin: 13.2 g/dL (ref 12.0–15.0)
MCH: 31.4 pg (ref 26.0–34.0)
MCHC: 34.7 g/dL (ref 30.0–36.0)
MCV: 90.5 fL (ref 80.0–100.0)
Platelets: 279 10*3/uL (ref 150–400)
RBC: 4.2 MIL/uL (ref 3.87–5.11)
RDW: 11.9 % (ref 11.5–15.5)
WBC: 5.7 10*3/uL (ref 4.0–10.5)
nRBC: 0 % (ref 0.0–0.2)

## 2022-01-16 NOTE — ED Triage Notes (Signed)
Pt comes via [facility] from school with c/o possible seizure. Pt has hx of POTS and has been having these episodes and passing out.  Pt denies any pain at this time.  VSS per [facility], 20 g in arm

## 2022-01-16 NOTE — ED Notes (Signed)
Pt is LWBS after triage due to having an appt with Dr. Malvin Johns today at 2:00.  Pt able to complete MSE signature.  Pt in NAD upon leaving facility.

## 2022-01-25 ENCOUNTER — Ambulatory Visit: Payer: BC Managed Care – PPO | Admitting: Psychologist

## 2022-01-29 ENCOUNTER — Other Ambulatory Visit: Payer: BC Managed Care – PPO

## 2022-01-29 IMAGING — CT CT HEAD W/O CM
4 series · 17 of 47 positions shown, 19 images · non-contrast
Comparison: None.

CLINICAL DATA: Seizure

EXAM:
CT HEAD WITHOUT CONTRAST
TECHNIQUE: Contiguous axial images were obtained from the base of the skull
through the vertex without intravenous contrast.

[Series 2: head wo · axial · 0.42mm/px · z∈[-184,-74]mm · 7 of 30 slices shown, 9 images]
[im 4/30  brain]
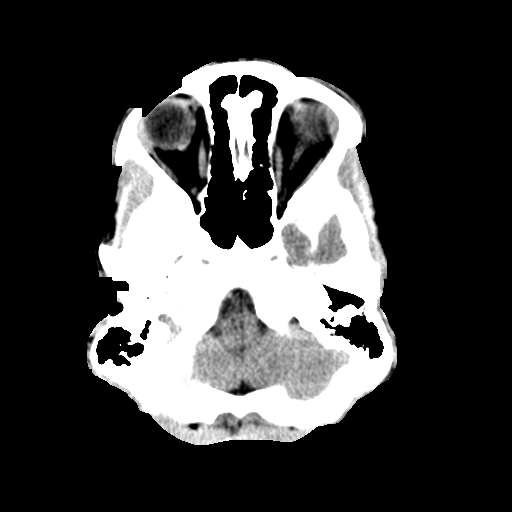
[im 4/30  bone]
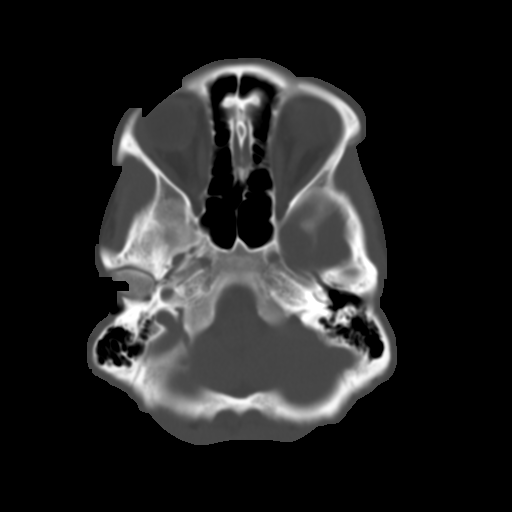
[im 8/30  brain]
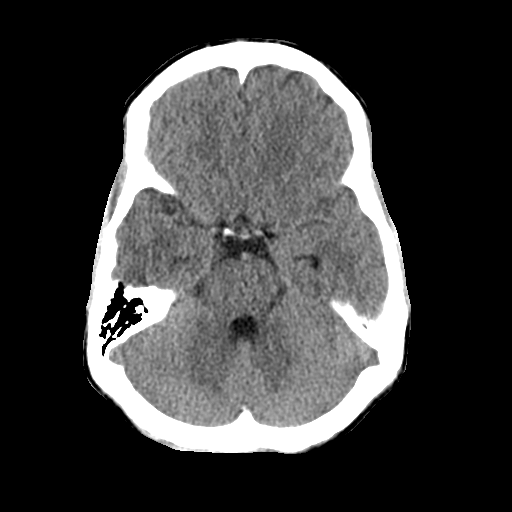
[im 11/30  brain]
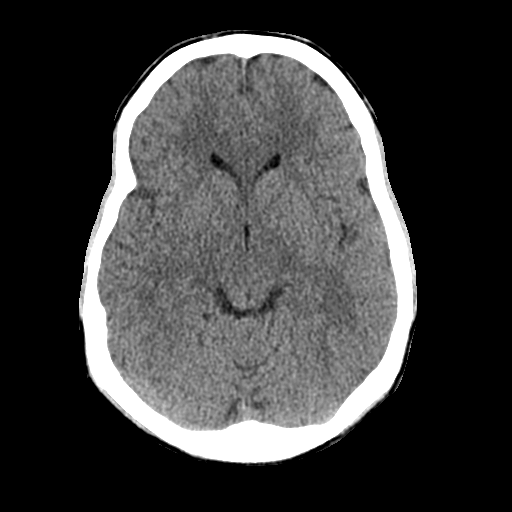
[im 15/30  brain]
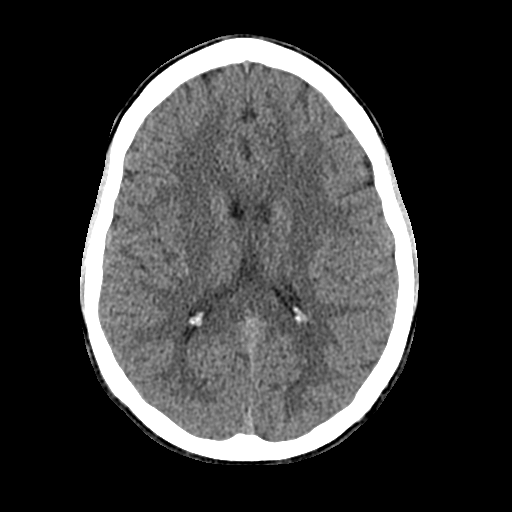
[im 19/30  brain]
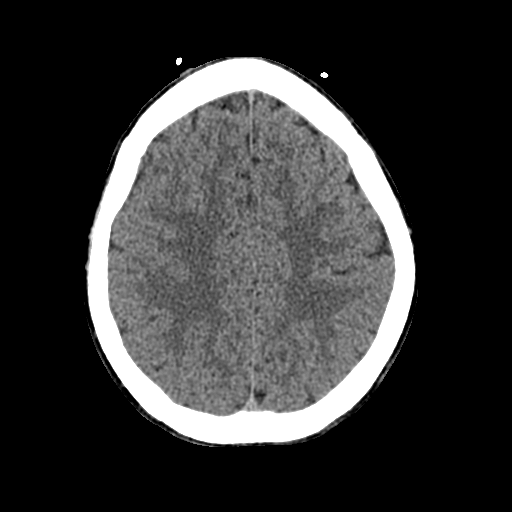
[im 19/30  bone]
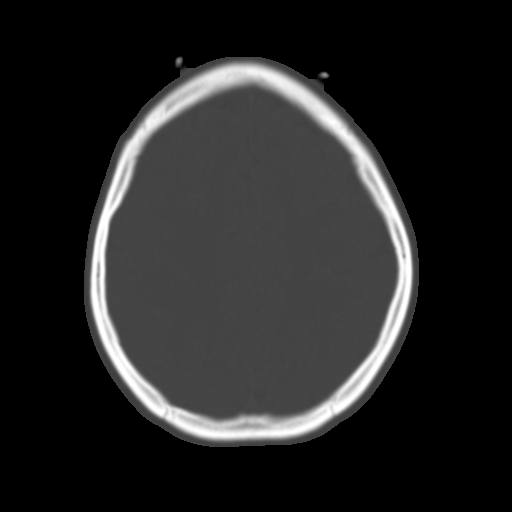
[im 22/30  brain]
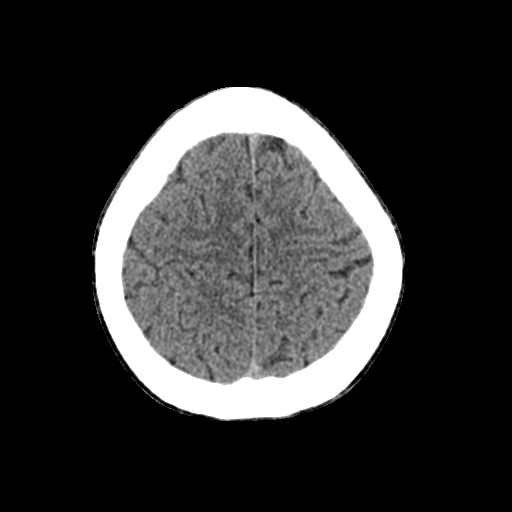
[im 26/30  brain]
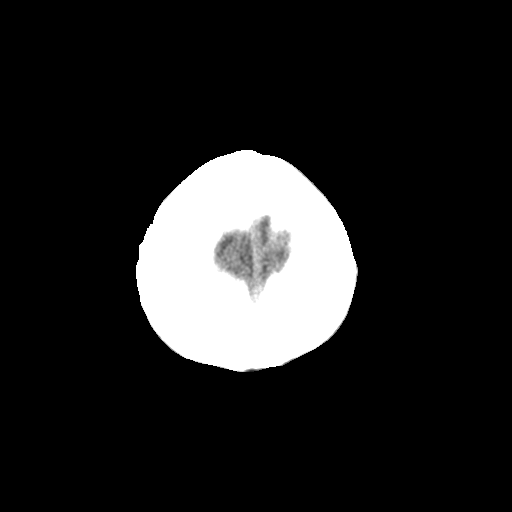

[Series 3: head bone · axial · 0.42mm/px · z∈[-185,-135]mm · 4 of 73 slices shown]
[im 8/73  bone]
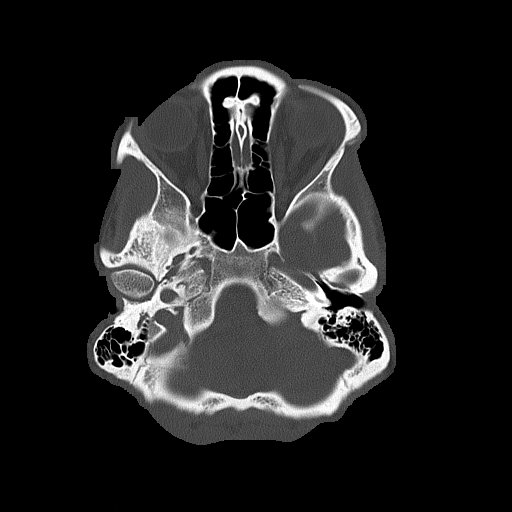
[im 15/73  bone]
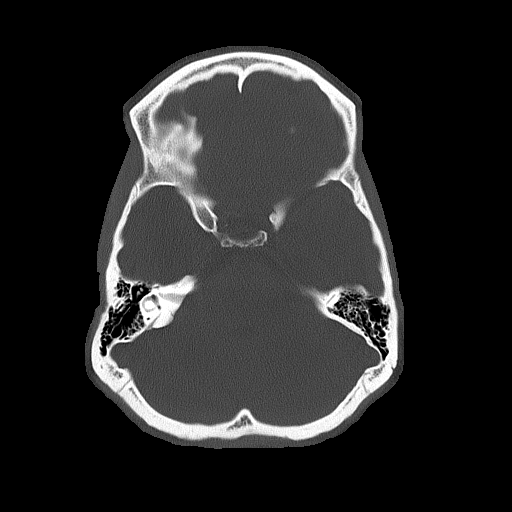
[im 22/73  bone]
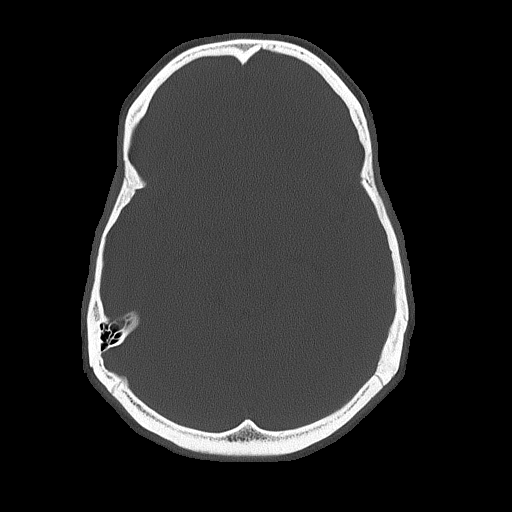
[im 33/73  bone]
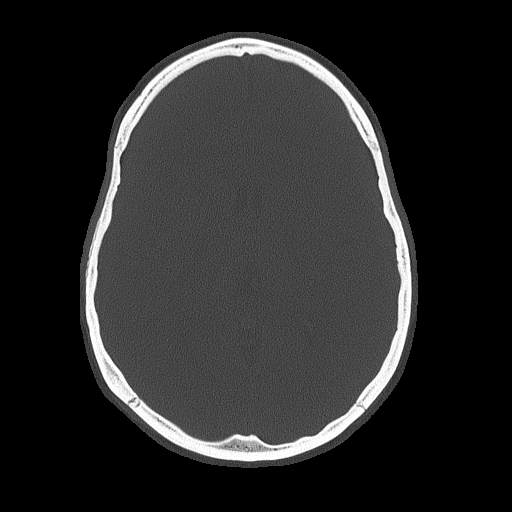

[Series 4: coronal soft tissue · coronal · 0.33mm/px · 3 of 63 slices shown]
[im 21/63  brain]
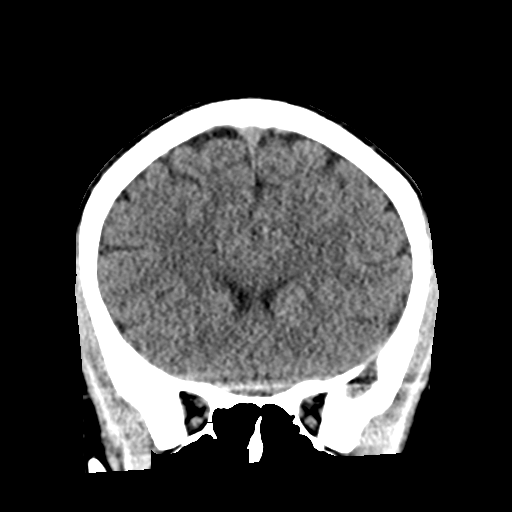
[im 28/63  brain]
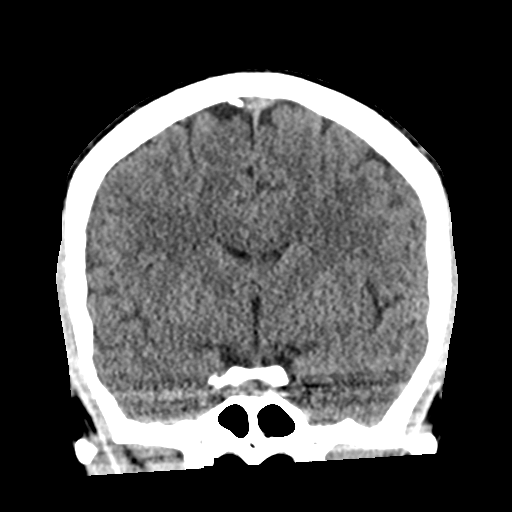
[im 35/63  brain]
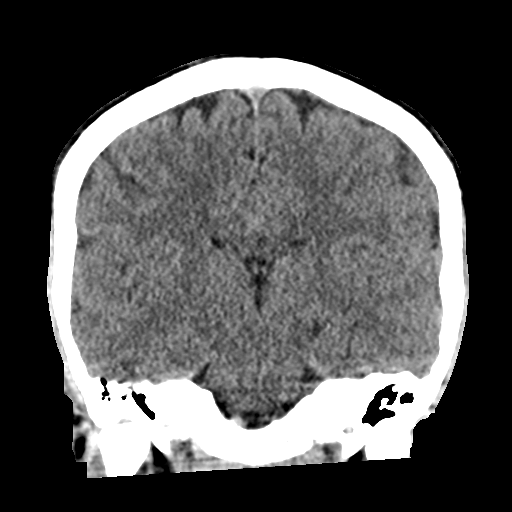

[Series 5: sagittal soft tissue · sagittal · 0.33mm/px · 3 of 54 slices shown]
[im 18/54  brain]
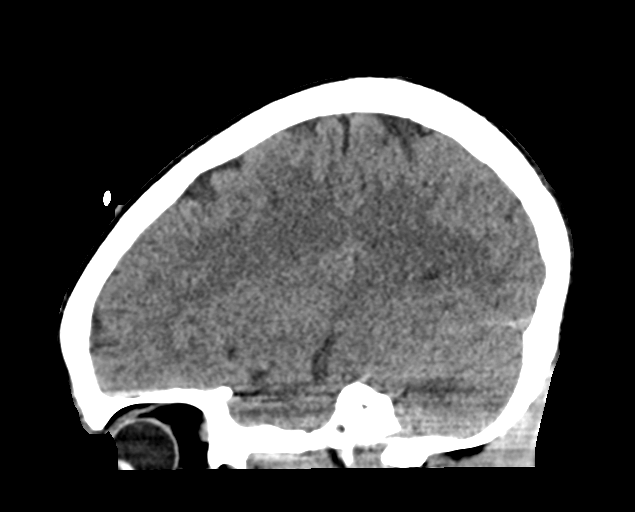
[im 27/54  brain]
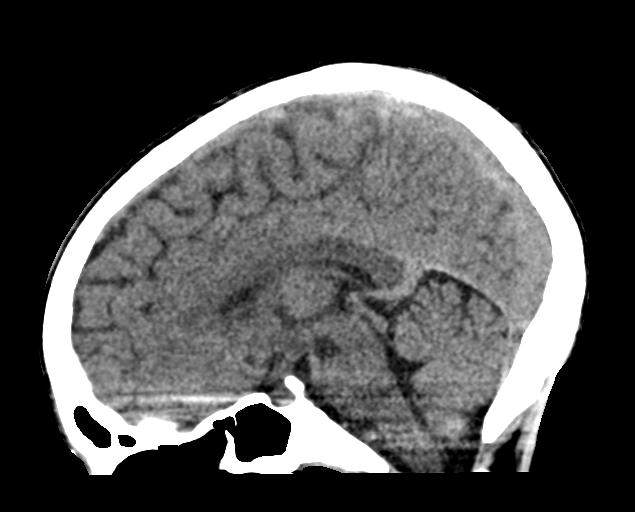
[im 36/54  brain]
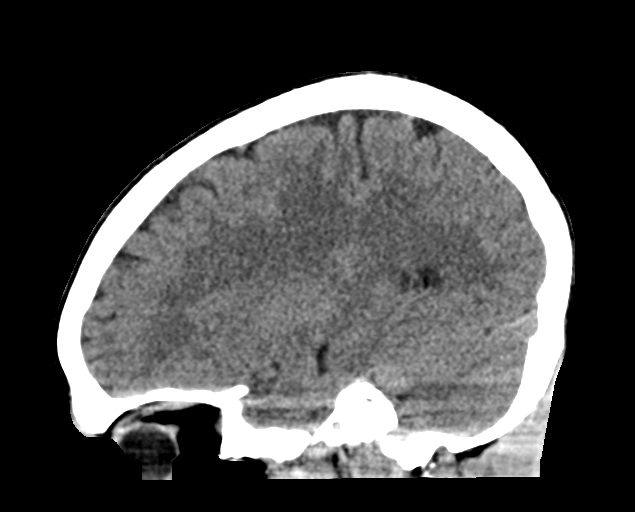

[17 of 47 positions shown; findings below may reference images not displayed]

FINDINGS: Brain: No evidence of acute infarction, hemorrhage, hydrocephalus,
extra-axial collection or mass lesion/mass effect.

Vascular: No hyperdense vessel or unexpected calcification.

Skull: Normal. Negative for fracture or focal lesion.

Sinuses/Orbits: No acute finding.

Other: None
IMPRESSION: Negative non contrasted CT appearance of the brain

## 2022-03-29 ENCOUNTER — Other Ambulatory Visit: Payer: Self-pay

## 2022-03-29 ENCOUNTER — Emergency Department
Admission: EM | Admit: 2022-03-29 | Discharge: 2022-03-29 | Disposition: A | Discharge status: Home / Self Care | Payer: BC Managed Care – PPO | Attending: Emergency Medicine | Admitting: Emergency Medicine

## 2022-03-29 DIAGNOSIS — R55 Syncope and collapse: Secondary | ICD-10-CM

## 2022-03-29 LAB — BASIC METABOLIC PANEL
Anion gap: 9 (ref 5–15)
BUN: 11 mg/dL (ref 6–20)
CO2: 25 mmol/L (ref 22–32)
Calcium: 9.4 mg/dL (ref 8.9–10.3)
Chloride: 106 mmol/L (ref 98–111)
Creatinine, Ser: 0.74 mg/dL (ref 0.44–1.00)
GFR, Estimated: >60 mL/min (ref >60)
Glucose, Bld: 89 mg/dL (ref 70–99)
Potassium: 3.7 mmol/L (ref 3.5–5.1)
Sodium: 140 mmol/L (ref 135–145)

## 2022-03-29 LAB — CBC
HCT: 39 % (ref 36.0–46.0)
Hemoglobin: 13 g/dL (ref 12.0–15.0)
MCH: 30.7 pg (ref 26.0–34.0)
MCHC: 33.3 g/dL (ref 30.0–36.0)
MCV: 92 fL (ref 80.0–100.0)
Platelets: 244 10*3/uL (ref 150–400)
RBC: 4.24 MIL/uL (ref 3.87–5.11)
RDW: 11.6 % (ref 11.5–15.5)
WBC: 5.2 10*3/uL (ref 4.0–10.5)
nRBC: 0 % (ref 0.0–0.2)

## 2022-03-29 LAB — MAGNESIUM: Magnesium: 2 mg/dL (ref 1.7–2.4)

## 2022-03-29 NOTE — ED Provider Notes (Signed)
? ?[facility] ?Provider Note ? ? ? Event Date/Time  ? First MD Initiated Contact with Patient 03/29/22 1231   ?  (approximate) ? ? ?History  ? ?Loss of Consciousness ? ? ?HPI ? ?Virginia Ochoa is a 21 y.o. female history of many episodes of syncope and/or seizure in the past ? ?Both patient and father present.  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  She then awoke in the care of EMS. ? ?She reports she has had the same symptoms and numerous times occur.  Her father reports the same.  They have seen neurologist and cardiologist had extensive work-ups they report at [facility] and have never received a clear cause or diagnosis. ? ?She denies any pain or discomfort.  She feels perfectly fine and back to normal now.  Denies any injury no chest pain no palpitations.  Denies pregnancy.  On her menstrual cycle since Friday normal.  Denies pregnancy ? ?Family history is unknown she was adopted ?  ? ? ?Physical Exam  ? ?Triage Vital Signs: ?ED Triage Vitals  ?Enc Vitals Group  ?   BP 03/29/22 1233 119/78  ?   Pulse Rate 03/29/22 1230 97  ?   Resp 03/29/22 1230 15  ?   Temp 03/29/22 1233 98 ?F (36.7 ?C)  ?   Temp Source 03/29/22 1233 Oral  ?   SpO2 03/29/22 1230 100 %  ?   Weight 03/29/22 1230 105 lb (47.6 kg)  ?   Height 03/29/22 1230 5\' 6"  (1.676 m)  ?   Head Circumference --   ?   Peak Flow --   ?   Pain Score 03/29/22 1230 0  ?   Pain Loc --   ?   Pain Edu? --   ?   Excl. in GC? --   ? ? ?Most recent vital signs: ?Vitals:  ? 03/29/22 1350 03/29/22 1410  ?BP:  121/79  ?Pulse: 69 94  ?Resp: 20 12  ?Temp:    ?SpO2: 92% 93%  ? ? ? ?General: Awake, no distress.  Very pleasant.  Resting comfortably without any distress. ?CV:  Good peripheral perfusion.  Normal heart tones.  Normal rate. ?Resp:  Normal effort.  Lung sounds clear  bilaterally.  Normal work of breathing. ?Abd:  No distention.  Soft nontender nondistended.  Does not appear gravid ?Other:  No lower extremity edema.  Moves all extremities well to command.  Normal extraocular movements.  Normal facial smile.  No distress ? ?Very reassuring nontoxic well-appearing exam ? ? ?ED Results / Procedures / Treatments  ? ?Labs ?(all labs ordered are listed, but only abnormal results are displayed) ?Labs Reviewed  ?BASIC METABOLIC PANEL  ?CBC  ?MAGNESIUM  ?CBG MONITORING, ED  ? ? ? ?EKG ? ?Reviewed inter by me at 1230 ?Heart rate 90 ?QRS 90 ?QT appears to be approximately 380 ?Normal sinus rhythm no evidence of ischemia or ectopy. ? ? ?RADIOLOGY ? ? ? ? ?PROCEDURES: ? ?Critical Care performed: No ? ?Procedures ? ? ?MEDICATIONS ORDERED IN ED: ?Medications - No data to display ? ? ?IMPRESSION / MDM / ASSESSMENT AND PLAN / ED COURSE  ?I reviewed the triage vital signs and the nursing notes. ?             ?               ? ?  Differential diagnosis includes, but is not limited to, recurrent syncopal episodes of unclear nature.  The patient has seen specialist including neurology and cardiology with no clear etiology.  Her presentation today being very similar to prior.  She is alert well oriented normal hemodynamics now.  She had extensive work-ups.  No injury today ? ?Discussed with patient and father, checked labs here.  Lab work including CBC and metabolic panel are normal.  EKG here appears normal without evidence of Brugada, congenital abnormality, or life-threatening electrocardiographic abnormality ? ?The patient is on the cardiac monitor to evaluate for evidence of arrhythmia and/or significant heart rate changes. ? ?----------------------------------------- ?2:44 PM on 03/29/2022 ?----------------------------------------- ?Patient fully alert well oriented, she and her father both comfortable with the plan for discharge.  Recommended against her driving until cleared by additional  physician or primary care physician.  At this point I recommend she not drive ? ?She and her father both very understanding agreeable with plan for discharge.  We will follow-up with primary care physician ? ?  ?Reviewed multiple medical records of the patient and recent evaluations including recent consultation with neurology with Dr. Malvin Johns.  She had previous MRIs of the brain EEG monitoring without noted seizure-like activity.  Also reviewed notes from Dr. Renato Gails of cardiology, his notes do note that the patient has followed also with [facility] as the patient and her father report, she has had previous Holter monitoring.  Dr. Renato Gails advised that he is concerned patient may have some type of conversion disorder ? ?FINAL CLINICAL IMPRESSION(S) / ED DIAGNOSES  ? ?Final diagnoses:  ?Syncope and collapse  ? ? ? ?Rx / DC Orders  ? ?ED Discharge Orders   ? ? None  ? ?  ? ? ? ?Note:  This document was prepared using Dragon voice recognition software and may include unintentional dictation errors. ?  Sharyn Creamer, MD ?03/29/22 1445 ? ?

## 2022-03-29 NOTE — ED Triage Notes (Signed)
Pt was found by coworker on the floor unresponsive. EMS state pt became responsive en route spontaneously, states she has been having diagnostic testing done for recent seizure activity- states she remembers feeling a seizure "coming on." Pt denies any pain. Per EMS BGL 99, was tachycardic but other VS WNL. No obvious deformity of bleeding noted, no obvious injury to head or face but unknown if head was hit during fall. Pt does not take any medication. Pt AOX4 at this time, NAD noted. Pt tremulous.  ?

## 2022-03-29 NOTE — Discharge Instructions (Signed)
I recommend you do not operate a car until cleared by your primary doctor or another physician.  You could lose control and potentially crash.  Do not put yourself in an unsafe situation or environment where if you were to have an unexpected episode you could become injured by falling. ? ?Please follow-up with your primary care physician. ?

## 2022-03-29 NOTE — ED Notes (Signed)
Pt given snack per request. Urine sent to lab at this time.  ?

## 2022-04-07 ENCOUNTER — Other Ambulatory Visit: Payer: Self-pay

## 2022-04-07 ENCOUNTER — Emergency Department
Admission: EM | Admit: 2022-04-07 | Discharge: 2022-04-07 | Disposition: A | Discharge status: Home / Self Care | Payer: BC Managed Care – PPO | Attending: Emergency Medicine | Admitting: Emergency Medicine

## 2022-04-07 DIAGNOSIS — F439 Reaction to severe stress, unspecified: Secondary | ICD-10-CM | POA: Diagnosis not present

## 2022-04-07 DIAGNOSIS — R531 Weakness: Secondary | ICD-10-CM | POA: Diagnosis present

## 2022-04-07 LAB — CBG MONITORING, ED
CBG: 70
Glucose-Capillary: 70 mg/dL (ref 70–99)

## 2022-04-07 NOTE — ED Provider Notes (Addendum)
? ?[facility] ?Provider Note ? ? ? Event Date/Time  ? First MD Initiated Contact with Patient 04/07/22 0057   ?  (approximate) ? ? ?History  ? ?Weakness ? ? ?HPI ? ?Virginia Ochoa is a 21 y.o. female who presents to the ED for evaluation of Weakness ?  ?I reviewed recurrent ED visits for syncope and collapse.  I reviewed outpatient neurology visit from 1/23, evaluated for the same.  Has had an EEG, routine and extended, that were normal.  Electively stopped due to side effects.  She is prescribed as needed benzodiazepines. ? ?Patient presents to the ED from home via EMS for evaluation of an episode of poor responsiveness.  EMS was called out because patient was unresponsive for 1.5 hours at home.  ? ?Here in the ED, patient is initially mute and refuses to participate.  I threatened to administer smelling salts and she quickly awakens and chat with me.  She is pleasant and conversational.  She reports "is just the same thing as always."  She reports that "no one listens to me at home, I am always fine, but they make me come get checked out."  She reports that she feels fine right now and is requesting discharge.  Denies any additional focal stressors at home.  Denies any suicidal intent or taking too much medication this evening.  Denies any recreational drug use. ? ?Physical Exam  ? ?Triage Vital Signs: ?ED Triage Vitals  ?Enc Vitals Group  ?   BP   ?   Pulse   ?   Resp   ?   Temp   ?   Temp src   ?   SpO2   ?   Weight   ?   Height   ?   Head Circumference   ?   Peak Flow   ?   Pain Score   ?   Pain Loc   ?   Pain Edu?   ?   Excl. in GC?   ? ? ?Most recent vital signs: ?Vitals:  ? 04/07/22 0103 04/07/22 0104  ?BP:  110/73  ?Pulse:  88  ?Resp:  16  ?Temp:  98 ?F (36.7 ?C)  ?SpO2: 98% 98%  ? ? ?General: Awake, no distress.  Pleasant and conversational. ?CV:  Good peripheral perfusion.  ?Resp:  Normal effort.  ?Abd:  No distention.  ?MSK:  No deformity noted.  ?Neuro:  No focal deficits  appreciated. Cranial nerves II through XII intact ?5/5 strength and sensation in all 4 extremities ?Other:   ? ? ?ED Results / Procedures / Treatments  ? ?Labs ?(all labs ordered are listed, but only abnormal results are displayed) ?Labs Reviewed  ?CBG MONITORING, ED  ?CBG MONITORING, ED  ? ? ?EKG ?Sinus rhythm, rate of 96 bpm.  Normal axis and intervals.  No evidence of acute ischemia. ? ?RADIOLOGY ? ? ?Official radiology report(s): ?No results found. ? ?PROCEDURES and INTERVENTIONS: ? ?.1-3 Lead EKG Interpretation ?Performed by: Delton Prairie, MD ?Authorized by: Delton Prairie, MD  ? ?  Interpretation: normal   ?  ECG rate:  80 ?  ECG rate assessment: normal   ?  Rhythm: sinus rhythm   ?  Ectopy: none   ?  Conduction: normal   ? ?Medications - No data to display ? ? ?IMPRESSION / MDM / ASSESSMENT AND PLAN / ED COURSE  ?I reviewed the triage vital signs and the nursing notes. ? ?21 year old woman presents to the  ED after being poorly responsive at home, likely behavioral and ultimately suitable for outpatient management.  She has normal vital signs and looks well systemically on examination.  No signs of toxidrome, neurologic or vascular deficits, trauma.  She quickly arouses and converses with me, explaining stressors at home and that no one listens to her.  Reach out for a little while she reports feeling better and is requesting discharge.  EKG is normal without interval changes, normal CBG.  She is tolerating p.o. intake.  I see no barriers to outpatient management.  We will discharge with return precautions. ? ?Clinical Course as of 04/07/22 0133  ?Wynelle Link Apr 07, 2022  ?0131 Reassessed.  Father now at the bedside.  He describes that he saw at home.  We discussed reassuring presentation from my perspective and I see no indications for further diagnostics.  Patient reports that she is ready to go home.  She stands up, ambulates around the room and reports feeling fine. [DS]  ?  ?Clinical Course User Index ?[DS] Delton Prairie, MD  ? ? ? ?FINAL CLINICAL IMPRESSION(S) / ED DIAGNOSES  ? ?Final diagnoses:  ?Generalized weakness  ?Stress at home  ? ? ? ?Rx / DC Orders  ? ?ED Discharge Orders   ? ? None  ? ?  ? ? ? ?Note:  This document was prepared using Dragon voice recognition software and may include unintentional dictation errors. ?  ?Delton Prairie, MD ?04/07/22 0115 ? ?  ?Delton Prairie, MD ?04/07/22 0133 ? ?

## 2022-04-07 NOTE — ED Triage Notes (Signed)
Was found in the family garage [place], family covered her with a blanket to make sure she was comfortable and warm.  Approximately 90 mins later when they checked on her she remained unresponsive.  Upon EMS arrival patient had been moved to the couch with minimal responses. ?

## 2022-04-07 NOTE — ED Notes (Signed)
Taking PO's well ?

## 2022-05-09 ENCOUNTER — Encounter: Payer: Self-pay | Admitting: Neurology

## 2022-05-09 ENCOUNTER — Ambulatory Visit: Payer: BC Managed Care – PPO | Admitting: Neurology

## 2022-05-09 VITALS — BP 98/58 | HR 89 | Ht 66.0 in | Wt 100.0 lb

## 2022-05-09 DIAGNOSIS — R55 Syncope and collapse: Secondary | ICD-10-CM | POA: Diagnosis not present

## 2022-05-09 MED ORDER — SUMATRIPTAN SUCCINATE 50 MG PO TABS: 50.0000 mg | ORAL_TABLET | ORAL | 3 refills | Status: DC | PRN

## 2022-05-09 MED ORDER — AMITRIPTYLINE HCL 10 MG PO TABS: 10.0000 mg | ORAL_TABLET | Freq: Every day | ORAL | 3 refills | Status: DC

## 2022-05-09 NOTE — Progress Notes (Signed)
? ?[facility] ? ?PATIENT: Virginia Ochoa ?DOB: November 25, 2001 ? ?REQUESTING CLINICIAN: Danelle Berry, NP ?HISTORY FROM: Patient and mother  ?REASON FOR VISIT: Recurrent syncope  ? ? ?HISTORICAL ? ?CHIEF COMPLAINT:  ?Chief Complaint  ?Patient presents with  ? New Patient (Initial Visit)  ?  Room 15, mother ?NP/Paper/[facility]/Chelsa Charlynn Grimes NP [phone] episodes with confusion/slurred speech ?Reports many epidodes of passing out following confusion and body tremors, Last episode was 5/8 ? ? ? ?  ? ? ?HISTORY OF PRESENT ILLNESS:  ?This is a 21 year old woman with no reported past medical history who is presenting with episode of syncope.  Patient reports these episodes started 2 years ago and has been getting worse.  These episodes can last from 10-minutes all the way to 2 hours.  She presented to the ED multiple times and work-up has been negative.  She has seen both cardiology and neurology and work-up including 72-hour EEG was reported to be normal.  She also had routine EEG done which was negative.  She reports prior to the episode she will sometimes feel lightheaded dizziness but sometimes there are no warning signs.  She continued to have to experience these symptoms about once a month but they have days where she can have it daily for a week.  Denies any injury associated with these episodes.   ?  ? ?Handedness: Right  ? ?Onset: 2 years ago  ? ?Seizure Type: Unclear, syncope, collapse  ? ?Current frequency: Average monthly but can be daily for one week  ? ?Any injuries from seizures: None  ? ?Seizure risk factors: None reported (Patient is adopted, no additional history known)  ? ?Previous ASMs: None  ? ?Currenty ASMs: None  ? ?ASMs side effects: N/A  ? ?Brain Images: Normal MRI Brain 12/2021  ? ?Previous EEGs: Normal 12/2021 ? ? ?OTHER MEDICAL CONDITIONS: None  ? ?REVIEW OF SYSTEMS: Full 14 system review of systems performed and negative with exception of: as noted in the HPI    ? ?ALLERGIES: ?No Known Allergies ? ?HOME MEDICATIONS: ?Outpatient Medications Prior to Visit  ?Medication Sig Dispense Refill  ? norethindrone-ethinyl estradiol 1/35 ([place] 1/35, 28,) tablet Take 1 tablet by mouth daily. 28 tablet 11  ? ?No facility-administered medications prior to visit.  ? ? ?PAST MEDICAL HISTORY: ?Past Medical History:  ?Diagnosis Date  ? Known health problems: none 01/05/2019  ? Ovarian cyst   ? ? ?PAST SURGICAL HISTORY: ?Past Surgical History:  ?Procedure Laterality Date  ? none    ? ? ?FAMILY HISTORY: ?Family History  ?Adopted: Yes  ?Family history unknown: Yes  ? ? ?SOCIAL HISTORY: ?Social History  ? ?Socioeconomic History  ? Marital status: Single  ?  Spouse name: Not on file  ? Number of children: Not on file  ? Years of education: Not on file  ? Highest education level: Not on file  ?Occupational History  ? Not on file  ?Tobacco Use  ? Smoking status: Never  ? Smokeless tobacco: Never  ?Vaping Use  ? Vaping Use: Never used  ?Substance and Sexual Activity  ? Alcohol use: Never  ? Drug use: Never  ? Sexual activity: Never  ?  Birth control/protection: I.U.D.  ?Other Topics Concern  ? Not on file  ?Social History Narrative  ? Not on file  ? ?Social Determinants of Health  ? ?Financial Resource Strain: Not on file  ?Food Insecurity: Not on file  ?Transportation Needs: Not on file  ?Physical Activity: Not on file  ?  Stress: Not on file  ?Social Connections: Not on file  ?Intimate Partner Violence: Not on file  ? ? ?PHYSICAL EXAM ? ?GENERAL EXAM/CONSTITUTIONAL: ?Vitals:  ?Vitals:  ? 05/09/22 0849  ?BP: (!) 98/58  ?Pulse: 89  ?Weight: 100 lb (45.4 kg)  ?Height: 5\' 6"  (1.676 m)  ? ?Body mass index is 16.14 kg/m?. ?Wt Readings from Last 3 Encounters:  ?05/09/22 100 lb (45.4 kg) (4 %, Z= -1.80)*  ?04/07/22 105 lb (47.6 kg) (9 %, Z= -1.37)*  ?03/29/22 105 lb (47.6 kg) (9 %, Z= -1.36)*  ? ?* Growth percentiles are based on CDC (Girls, 2-20 Years) data.  ? ?Patient is in no distress; well  developed, nourished and groomed; neck is supple ? ?EYES: ?Pupils round and reactive to light, Visual fields full to confrontation, Extraocular movements intacts,  ?No results found. ? ?MUSCULOSKELETAL: ?Gait, strength, tone, movements noted in Neurologic exam below ? ?NEUROLOGIC: ?MENTAL STATUS:  ?   ? View : No data to display.  ?  ?  ?  ? ?awake, alert, oriented to person, place and time ?recent and remote memory intact ?normal attention and concentration ?language fluent, comprehension intact, naming intact ?fund of knowledge appropriate ? ?CRANIAL NERVE:  ?2nd, 3rd, 4th, 6th - pupils equal and reactive to light, visual fields full to confrontation, extraocular muscles intact, no nystagmus ?5th - facial sensation symmetric ?7th - facial strength symmetric ?8th - hearing intact ?9th - palate elevates symmetrically, uvula midline ?11th - shoulder shrug symmetric ?12th - tongue protrusion midline ? ?MOTOR:  ?normal bulk and tone, full strength in the BUE, BLE ? ?SENSORY:  ?normal and symmetric to light touch, pinprick, temperature, vibration ? ?COORDINATION:  ?finger-nose-finger, fine finger movements normal ? ?REFLEXES:  ?deep tendon reflexes present and symmetric ? ?GAIT/STATION:  ?normal ? ? ?DIAGNOSTIC DATA (LABS, IMAGING, TESTING) ?- I reviewed patient records, labs, notes, testing and imaging myself where available. ? ?Lab Results  ?Component Value Date  ? WBC 5.2 03/29/2022  ? HGB 13.0 03/29/2022  ? HCT 39.0 03/29/2022  ? MCV 92.0 03/29/2022  ? PLT 244 03/29/2022  ? ?   ?Component Value Date/Time  ? NA 140 03/29/2022 1226  ? K 3.7 03/29/2022 1226  ? CL 106 03/29/2022 1226  ? CO2 25 03/29/2022 1226  ? GLUCOSE 89 03/29/2022 1226  ? BUN 11 03/29/2022 1226  ? CREATININE 0.74 03/29/2022 1226  ? CALCIUM 9.4 03/29/2022 1226  ? PROT 7.7 11/22/2021 1705  ? ALBUMIN 4.1 11/22/2021 1705  ? AST 30 11/22/2021 1705  ? ALT 13 11/22/2021 1705  ? ALKPHOS 54 11/22/2021 1705  ? BILITOT 1.1 11/22/2021 1705  ? GFRNONAA >60  03/29/2022 1226  ? GFRAA NOT CALCULATED 06/09/2020 1946  ? ?No results found for: CHOL, HDL, LDLCALC, LDLDIRECT, TRIG ?No results found for: HGBA1C ?Lab Results  ?Component Value Date  ? [id number] 550 01/04/2022  ? ?Lab Results  ?Component Value Date  ? TSH 1.526 01/04/2022  ? ?MRI Brain 12/2021 ?Normal brain MRI. No acute intracranial abnormality identified. ? ?EEG 12/2021 ?This normal EEG is recorded in the waking and drowsy state. There was no seizure or seizure predisposition recorded on this study. Please note that lack of epileptiform activity on EEG does not preclude the possibility of epilepsy. ? ? ? ?ASSESSMENT AND PLAN ? ?21 y.o. year old female  with no reported past medical history who is presenting with 2-year history of syncopal episodes.  Patient reports sometimes she can have warning sign meaning lightheadedness prior to  the syncope.  These events can last 10 minutes all the way up to 2 hours.  There is no reported tongue biting, no urinary incontinence, no abnormal movements, or shaking associated with these events.  There were no injuries. Patient had extensive work-up including cardiology, and neurology, had a normal brain MRI, normal ambulatory EEG, reported available for review and they are all normal.  Beside that, she continues to experience these events.  I have informed patient that I will repeat the ambulatory EEG and if normal then these events are less likely epileptic and she will need to follow-up with primary care doctor for further evaluation.  At that time they may consider referral to psychiatry for evaluation of nonepileptic spells.  I will contact you after completion of the EEG.  Return in 3 months.  ? ? ?1. Syncope, unspecified syncope type   ? ? ?Patient Instructions  ?Continue current medication ?We will obtain a 72-hour ambulatory EEG, if normal then these events are like less likely seizures Continue to follow-up with your primary care doctor ?Return as needed ? ? ?Per [facility] statutes, patients with seizures are not allowed to drive until they have been seizure-free for six months.  Other recommendations include using caution when using heavy equipment or power tools. Av

## 2022-05-09 NOTE — Patient Instructions (Addendum)
Continue current medication ?We will obtain a 72-hour ambulatory EEG, if normal then these events are like less likely seizures Continue to follow-up with your primary care doctor ?Return in 3 months.  ?

## 2022-06-02 DIAGNOSIS — R55 Syncope and collapse: Secondary | ICD-10-CM

## 2022-06-20 ENCOUNTER — Encounter: Payer: Self-pay | Admitting: Neurology

## 2022-06-20 DIAGNOSIS — R55 Syncope and collapse: Secondary | ICD-10-CM

## 2022-06-20 NOTE — Procedures (Signed)
   Clinical History : 21 y/o female, No previous clinical history given.  INTERMITTENT MONITORING with VIDEO TECHNICAL SUMMARY: This AVEEG was performed using equipment provided by [employer] utilizing Bluetooth ( [employer] ) amplifiers with continuous EEGT attended video collection using encrypted remote transmission via [employer] secured cellular tower network with data rates for each AVEEG performed. This is a Therapist, music AVEEG, obtained, according to the 10-20 international electrode placement system, reformatted digitally into referential and bipolar montages. Data was acquired with a minimum of 21 bipolar connections and sampled at a minimum rate of 250 cycles per second per channel, maximum rate of 450 cycles per second per channel and two channels for EKG. The entire VEEG study was recorded through cable and or radio telemetry for subsequent analysis. Specified epochs of the AVEEG data were identified at the direction of the subject by the depression of a push button by the patient. Each patients event file included data acquired two minutes prior to the push button activation and continuing until two minutes afterwards. AVEEG files were reviewed on [employer] [facility] server, Licensed Software provided by [employer] with a digital high frequency filter set at 70 Hz and a low frequency filter set at 1 Hz with a paper speed of 39mm/s resulting in 10 seconds per digital page. This entire AVEEG was reviewed by the EEG Technologist. Random time samples, random sleep samples, clips, patient initiated push button files with included patient daily diary logs, EEG Technologist pruned data was reviewed and verified for accuracy and validity by the governing reading neurologist in full details. This AEEGV was fully compliant with all requirements for CPT 97500 for setup, patient education, take down and administered by an EEG technologist.  Long-Term EEG with Video was monitored intermittently  by a qualified EEG technologist for the entirety of the recording; quality check-ins were performed at a minimum of every two hours, checking and documenting real-time data and video to assure the integrity and quality of the recording (e.g., camera position, electrode integrity and impedance), and identify the need for maintenance. For intermittent monitoring, an EEG Technologist monitored no more than 12 patients concurrently. Diagnostic video was captured at least 80% of the time during the recording.   PATIENT EVENTS: No patient events were noted or captured in the study.  TECHNOLOGIST EVENTS: No clear epileptiform activity was detected by the reviewing neurodiagnostic technologist during the recording for further evaluation.  TIME SAMPLES: Ten-minutes of every two hours recorded are reviewed as random time samples.  SLEEP SAMPLES: 5-minutes of every 24 hour recorded sleep cycle are reviewed as random sleep samples.  AWAKE: At maximal level of alertness, the posterior dominant background activity was continuous, reactive, low voltage rhythm of 8 Hz. This was symmetric, well-modulated, and attenuated with eye opening. Diffuse, symmetric, frontocentral beta range activity was present.  SLEEP: N1 Sleep (Stage 1) was observed and characterized by the disappearance of alpha rhythm and the appearance of vertex activity.  N2 Sleep (Stage 2) was observed and characterized by vertex waves, K-complexes, and sleep spindles.  N3 (Stage 3) sleep was observed and characterized by high amplitude Delta activity of 20%.  REM sleep was observed.  EKG: There were no arrhythmias or abnormalities noted during this recording.   Impression: This is a normal 54 hours ambulatory video EEG, there were no events or seizures recorded. However, a normal EEG does not exclude a diagnosis of epilepsy   Windell Norfolk, MD [facility]

## 2022-08-08 ENCOUNTER — Other Ambulatory Visit: Payer: Self-pay | Admitting: Neurology

## 2022-08-08 NOTE — Telephone Encounter (Signed)
Rx filled to appointment date. 

## 2022-08-15 ENCOUNTER — Encounter: Payer: Self-pay | Admitting: Neurology

## 2022-08-15 ENCOUNTER — Ambulatory Visit (INDEPENDENT_AMBULATORY_CARE_PROVIDER_SITE_OTHER): Payer: BC Managed Care – PPO | Admitting: Neurology

## 2022-08-15 VITALS — BP 116/71 | HR 105 | Ht 66.0 in | Wt 105.0 lb

## 2022-08-15 DIAGNOSIS — R55 Syncope and collapse: Secondary | ICD-10-CM | POA: Diagnosis not present

## 2022-08-15 NOTE — Patient Instructions (Signed)
Follow up with PCP  Return as needed  

## 2022-08-15 NOTE — Progress Notes (Signed)
[facility]  PATIENT: Virginia Ochoa DOB: [birth year]  REQUESTING CLINICIAN: Margaretann Loveless, MD HISTORY FROM: Patient and mother  REASON FOR VISIT: Recurrent syncope    HISTORICAL  CHIEF COMPLAINT:  Chief Complaint  Patient presents with   Follow-up    Room 13,  States she is doing well reports no new episodes    INTERVAL HISTORY 08/15/2022:  Virginia presents today for follow-up, she is accompanied by her friend.  Since last visit in May she has not had any additional syncope.  Repeat ambulatory EEG was also normal.  Overall she is doing well. No current complaints.   HISTORY OF PRESENT ILLNESS:  This is a 21 year old woman with no reported past medical history who is presenting with episode of syncope.  Patient reports these episodes started 2 years ago and has been getting worse.  These episodes can last from 10-minutes all the way to 2 hours.  She presented to the ED multiple times and work-up has been negative.  She has seen both cardiology and neurology and work-up including 72-hour EEG was reported to be normal.  She also had routine EEG done which was negative.  She reports prior to the episode she will sometimes feel lightheaded dizziness but sometimes there are no warning signs.  She continued to have to experience these symptoms about once a month but they have days where she can have it daily for a week.  Denies any injury associated with these episodes.      Handedness: Right   Onset: 2 years ago   Seizure Type: Unclear, syncope, collapse   Current frequency: Average monthly but can be daily for one week   Any injuries from seizures: None   Seizure risk factors: None reported (Patient is adopted, no additional history known)   Previous ASMs: None   Currenty ASMs: None   ASMs side effects: N/A   Brain Images: Normal MRI Brain 12/2021   Previous EEGs: Normal 12/2021   OTHER MEDICAL CONDITIONS: None   REVIEW OF SYSTEMS: Full 14 system  review of systems performed and negative with exception of: as noted in the HPI    ALLERGIES: No Known Allergies  HOME MEDICATIONS: Outpatient Medications Prior to Visit  Medication Sig Dispense Refill   amitriptyline (ELAVIL) 10 MG tablet TAKE 1 TABLET BY MOUTH AT BEDTIME. 30 tablet 0   SUMAtriptan (IMITREX) 50 MG tablet Take 1 tablet (50 mg total) by mouth every 2 (two) hours as needed for migraine. May repeat in 2 hours if headache persists or recurs. 10 tablet 3   No facility-administered medications prior to visit.    PAST MEDICAL HISTORY: Past Medical History:  Diagnosis Date   Known health problems: none 01/05/2019   Ovarian cyst     PAST SURGICAL HISTORY: Past Surgical History:  Procedure Laterality Date   none      FAMILY HISTORY: Family History  Adopted: Yes  Family history unknown: Yes    SOCIAL HISTORY: Social History   Socioeconomic History   Marital status: Single    Spouse name: Not on file   Number of children: Not on file   Years of education: Not on file   Highest education level: Not on file  Occupational History   Not on file  Tobacco Use   Smoking status: Never   Smokeless tobacco: Never  Vaping Use   Vaping Use: Never used  Substance and Sexual Activity   Alcohol use: Never   Drug use: Never  Sexual activity: Never    Birth control/protection: I.U.D.  Other Topics Concern   Not on file  Social History Narrative   Not on file   Social Determinants of Health   Financial Resource Strain: Not on file  Food Insecurity: Not on file  Transportation Needs: Not on file  Physical Activity: Not on file  Stress: Not on file  Social Connections: Not on file  Intimate Partner Violence: Not on file    PHYSICAL EXAM  GENERAL EXAM/CONSTITUTIONAL: Vitals:  Vitals:   08/15/22 1012  BP: 116/71  Pulse: (!) 105  Weight: 105 lb (47.6 kg)  Height: 5\' 6"  (1.676 m)    Body mass index is 16.95 kg/m. Wt Readings from Last 3 Encounters:   08/15/22 105 lb (47.6 kg) (8 %, Z= -1.39)*  05/09/22 100 lb (45.4 kg) (4 %, Z= -1.80)*  04/07/22 105 lb (47.6 kg) (9 %, Z= -1.37)*   * Growth percentiles are based on CDC (Girls, 2-20 Years) data.   Patient is in no distress; well developed, nourished and groomed; neck is supple  EYES: Pupils round and reactive to light, Visual fields full to confrontation, Extraocular movements intacts,  No results found.  MUSCULOSKELETAL: Gait, strength, tone, movements noted in Neurologic exam below  NEUROLOGIC: MENTAL STATUS:      No data to display         awake, alert, oriented to person, place and time recent and remote memory intact normal attention and concentration language fluent, comprehension intact, naming intact fund of knowledge appropriate  CRANIAL NERVE:  2nd, 3rd, 4th, 6th - pupils equal and reactive to light, visual fields full to confrontation, extraocular muscles intact, no nystagmus 5th - facial sensation symmetric 7th - facial strength symmetric 8th - hearing intact 9th - palate elevates symmetrically, uvula midline 11th - shoulder shrug symmetric 12th - tongue protrusion midline  MOTOR:  normal bulk and tone, full strength in the BUE, BLE  SENSORY:  normal and symmetric to light touch, pinprick, temperature, vibration  COORDINATION:  finger-nose-finger, fine finger movements normal  REFLEXES:  deep tendon reflexes present and symmetric  GAIT/STATION:  normal   DIAGNOSTIC DATA (LABS, IMAGING, TESTING) - I reviewed patient records, labs, notes, testing and imaging myself where available.  Lab Results  Component Value Date   WBC 5.2 03/29/2022   HGB 13.0 03/29/2022   HCT 39.0 03/29/2022   MCV 92.0 03/29/2022   PLT 244 03/29/2022      Component Value Date/Time   NA 140 03/29/2022 1226   K 3.7 03/29/2022 1226   CL 106 03/29/2022 1226   CO2 25 03/29/2022 1226   GLUCOSE 89 03/29/2022 1226   BUN 11 03/29/2022 1226   CREATININE 0.74 03/29/2022  1226   CALCIUM 9.4 03/29/2022 1226   PROT 7.7 11/22/2021 1705   ALBUMIN 4.1 11/22/2021 1705   AST 30 11/22/2021 1705   ALT 13 11/22/2021 1705   ALKPHOS 54 11/22/2021 1705   BILITOT 1.1 11/22/2021 1705   GFRNONAA >60 03/29/2022 1226   GFRAA NOT CALCULATED 06/09/2020 1946   No results found for: "CHOL", "HDL", "LDLCALC", "LDLDIRECT", "TRIG" No results found for: "HGBA1C" Lab Results  Component Value Date   VITAMINB12 550 01/04/2022   Lab Results  Component Value Date   TSH 1.526 01/04/2022   MRI Brain 12/2021 Normal brain MRI. No acute intracranial abnormality identified.  EEG 12/2021 This normal EEG is recorded in the waking and drowsy state. There was no seizure or seizure predisposition recorded on  this study. Please note that lack of epileptiform activity on EEG does not preclude the possibility of epilepsy.   Ambulatory EEG 05/2022 This is a normal 54 hours ambulatory video EEG, there were no events or seizures recorded. However, a normal EEG does not exclude a diagnosis of epilepsy   ASSESSMENT AND PLAN  21 y.o. year old female  with no reported past medical history who is presenting for follow up 2-year history of syncopal episodes.  Her work-up including 72-hour EEG was normal.  Patient syncope likely vasovagal.  She reports since last visit in May she has not had any additional symptoms.  I did advise her to continue following with PCP and return as needed.  She voices understanding.    1. Syncope, unspecified syncope type      Patient Instructions  Follow up with PCP  Return as needed    Per [facility] statutes, patients with seizures are not allowed to drive until they have been seizure-free for six months.  Other recommendations include using caution when using heavy equipment or power tools. Avoid working on ladders or at heights. Take showers instead of baths.  Do not swim alone.  Ensure the water temperature is not too high on the home water heater. Do  not go swimming alone. Do not lock yourself in a room alone (i.e. bathroom). When caring for infants or small children, sit down when holding, feeding, or changing them to minimize risk of injury to the child in the event you have a seizure. Maintain good sleep hygiene. Avoid alcohol.  Also recommend adequate sleep, hydration, good diet and minimize stress.   During the Seizure  - First, ensure adequate ventilation and place patients on the floor on their left side  Loosen clothing around the neck and ensure the airway is patent. If the patient is clenching the teeth, do not force the mouth open with any object as this can cause severe damage - Remove all items from the surrounding that can be hazardous. The patient may be oblivious to what's happening and may not even know what he or she is doing. If the patient is confused and wandering, either gently guide him/her away and block access to outside areas - Reassure the individual and be comforting - Call 911. In most cases, the seizure ends before EMS arrives. However, there are cases when seizures may last over 3 to 5 minutes. Or the individual may have developed breathing difficulties or severe injuries. If a pregnant patient or a person with diabetes develops a seizure, it is prudent to call an ambulance. - Finally, if the patient does not regain full consciousness, then call EMS. Most patients will remain confused for about 45 to 90 minutes after a seizure, so you must use judgment in calling for help. - Avoid restraints but make sure the patient is in a bed with padded side rails - Place the individual in a lateral position with the neck slightly flexed; this will help the saliva drain from the mouth and prevent the tongue from falling backward - Remove all nearby furniture and other hazards from the area - Provide verbal assurance as the individual is regaining consciousness - Provide the patient with privacy if possible - Call for help and  start treatment as ordered by the caregiver   After the Seizure (Postictal Stage)  After a seizure, most patients experience confusion, fatigue, muscle pain and/or a headache. Thus, one should permit the individual to sleep. For the next  few days, reassurance is essential. Being calm and helping reorient the person is also of importance.  Most seizures are painless and end spontaneously. Seizures are not harmful to others but can lead to complications such as stress on the lungs, brain and the heart. Individuals with prior lung problems may develop labored breathing and respiratory distress.     No orders of the defined types were placed in this encounter.   No orders of the defined types were placed in this encounter.   Return if symptoms worsen or fail to improve.   Windell Norfolk, MD 08/15/2022, 4:52 PM  [facility] [address] [place], [place] [address] [phone]

## 2022-10-14 ENCOUNTER — Ambulatory Visit: Payer: BC Managed Care – PPO | Admitting: Dermatology

## 2023-02-12 DIAGNOSIS — R000 Tachycardia, unspecified: Secondary | ICD-10-CM | POA: Insufficient documentation

## 2023-02-21 ENCOUNTER — Ambulatory Visit: Payer: BC Managed Care – PPO | Admitting: Nurse Practitioner

## 2023-07-07 ENCOUNTER — Other Ambulatory Visit: Payer: Self-pay | Admitting: Nurse Practitioner

## 2023-10-06 ENCOUNTER — Other Ambulatory Visit: Payer: Self-pay | Admitting: Internal Medicine

## 2023-10-06 MED ORDER — NORETHIN ACE-ETH ESTRAD-FE 1-20 MG-MCG PO TABS: 1.0000 | ORAL_TABLET | Freq: Every day | ORAL | 1 refills | Status: DC

## 2023-10-09 ENCOUNTER — Other Ambulatory Visit: Payer: Self-pay | Admitting: Family

## 2023-10-13 ENCOUNTER — Encounter: Payer: Self-pay | Admitting: Family

## 2023-10-13 ENCOUNTER — Ambulatory Visit: Payer: BC Managed Care – PPO | Admitting: Family

## 2023-10-13 VITALS — BP 110/72 | HR 102 | Ht 66.0 in | Wt 113.0 lb

## 2023-10-13 DIAGNOSIS — N920 Excessive and frequent menstruation with regular cycle: Secondary | ICD-10-CM

## 2023-10-13 DIAGNOSIS — Z3041 Encounter for surveillance of contraceptive pills: Secondary | ICD-10-CM

## 2023-10-13 MED ORDER — NORETHIN ACE-ETH ESTRAD-FE 1-20 MG-MCG PO TABS: 1.0000 | ORAL_TABLET | Freq: Every day | ORAL | 1 refills | Status: DC

## 2023-10-13 NOTE — Progress Notes (Signed)
Established Patient Office Visit  Subjective:  Patient ID: Virginia Ochoa, female    DOB: 2001/04/11  Age: 22 y.o. MRN: [id number]  Chief Complaint  Patient presents with   Follow-up    Birth control refills    Patient is here for follow up so that she can get a refill on her OCP.  She is doing well in general, no new concerns.     No other concerns at this time.   Past Medical History:  Diagnosis Date   Known health problems: none 01/05/2019   Ovarian cyst     Past Surgical History:  Procedure Laterality Date   none      Social History   Socioeconomic History   Marital status: Single    Spouse name: Not on file   Number of children: Not on file   Years of education: Not on file   Highest education level: Not on file  Occupational History   Not on file  Tobacco Use   Smoking status: Never   Smokeless tobacco: Never  Vaping Use   Vaping status: Never Used  Substance and Sexual Activity   Alcohol use: Never   Drug use: Never   Sexual activity: Never    Birth control/protection: I.U.D.  Other Topics Concern   Not on file  Social History Narrative   Not on file   Social Determinants of Health   Financial Resource Strain: Not on file  Food Insecurity: Not on file  Transportation Needs: Not on file  Physical Activity: Not on file  Stress: Not on file  Social Connections: Not on file  Intimate Partner Violence: Not on file    Family History  Adopted: Yes  Family history unknown: Yes    No Known Allergies  Review of Systems  All other systems reviewed and are negative.      Objective:   BP 110/72   Pulse (!) 102   Ht 5\' 6"  (1.676 m)   Wt 113 lb (51.3 kg)   SpO2 99%   BMI 18.24 kg/m   Vitals:   10/13/23 1324  BP: 110/72  Pulse: (!) 102  Height: 5\' 6"  (1.676 m)  Weight: 113 lb (51.3 kg)  SpO2: 99%  BMI (Calculated): 18.25    Physical Exam Vitals and nursing note reviewed.  Constitutional:      Appearance: Normal appearance.  She is normal weight.  HENT:     Head: Normocephalic.  Eyes:     Extraocular Movements: Extraocular movements intact.     Conjunctiva/sclera: Conjunctivae normal.     Pupils: Pupils are equal, round, and reactive to light.  Cardiovascular:     Rate and Rhythm: Normal rate.  Pulmonary:     Effort: Pulmonary effort is normal.     Breath sounds: Normal breath sounds.  Neurological:     General: No focal deficit present.     Mental Status: She is alert and oriented to person, place, and time. Mental status is at baseline.  Psychiatric:        Mood and Affect: Mood normal.        Behavior: Behavior normal.        Thought Content: Thought content normal.        Judgment: Judgment normal.      No results found for any visits on 10/13/23.  No results found for this or any previous visit (from the past 2160 hour(s)).     Assessment & Plan:   Problem List Items Addressed  This Visit       Active Problems   Menorrhagia - Primary    Patient stable.  Well controlled with current therapy.   Continue current meds.       Other Visit Diagnoses     Encounter for surveillance of contraceptive pills       Patient stable.  Well controlled with current therapy.  Continue current meds.       Return in about 6 months (around 04/12/2024) for F/U.   Total time spent: 20 minutes  Miki Kins, FNP  10/13/2023   This document may have been prepared by [facility] Voice Recognition software and as such may include unintentional dictation errors.

## 2023-10-13 NOTE — Assessment & Plan Note (Signed)
Patient stable.  Well controlled with current therapy.   Continue current meds.  

## 2023-11-11 ENCOUNTER — Other Ambulatory Visit: Payer: Self-pay | Admitting: Family

## 2024-01-16 ENCOUNTER — Other Ambulatory Visit: Payer: Self-pay | Admitting: Family

## 2024-02-20 ENCOUNTER — Other Ambulatory Visit: Payer: Self-pay | Admitting: Family

## 2024-04-12 ENCOUNTER — Ambulatory Visit: Payer: Self-pay | Admitting: Family

## 2024-04-12 ENCOUNTER — Encounter: Payer: Self-pay | Admitting: Family

## 2024-04-12 VITALS — BP 100/70 | HR 104 | Ht 66.0 in | Wt 117.0 lb

## 2024-04-12 DIAGNOSIS — R42 Dizziness and giddiness: Secondary | ICD-10-CM

## 2024-04-12 DIAGNOSIS — E782 Mixed hyperlipidemia: Secondary | ICD-10-CM | POA: Diagnosis not present

## 2024-04-12 DIAGNOSIS — I10 Essential (primary) hypertension: Secondary | ICD-10-CM

## 2024-04-12 DIAGNOSIS — R5383 Other fatigue: Secondary | ICD-10-CM

## 2024-04-12 DIAGNOSIS — E538 Deficiency of other specified B group vitamins: Secondary | ICD-10-CM

## 2024-04-12 DIAGNOSIS — M542 Cervicalgia: Secondary | ICD-10-CM

## 2024-04-12 DIAGNOSIS — R7303 Prediabetes: Secondary | ICD-10-CM

## 2024-04-12 DIAGNOSIS — E559 Vitamin D deficiency, unspecified: Secondary | ICD-10-CM

## 2024-04-13 LAB — CBC WITH DIFFERENTIAL/PLATELET
Basophils Absolute: 0 10*3/uL (ref 0.0–0.2)
Basos: 1 %
EOS (ABSOLUTE): 0.1 10*3/uL (ref 0.0–0.4)
Eos: 1 %
Hematocrit: 36.6 % (ref 34.0–46.6)
Hemoglobin: 12.7 g/dL (ref 11.1–15.9)
Immature Grans (Abs): 0 10*3/uL (ref 0.0–0.1)
Immature Granulocytes: 0 %
Lymphocytes Absolute: 1.2 10*3/uL (ref 0.7–3.1)
Lymphs: 22 %
MCH: 31.6 pg (ref 26.6–33.0)
MCHC: 34.7 g/dL (ref 31.5–35.7)
MCV: 91 fL (ref 79–97)
Monocytes Absolute: 0.4 10*3/uL (ref 0.1–0.9)
Monocytes: 7 %
Neutrophils Absolute: 3.8 10*3/uL (ref 1.4–7.0)
Neutrophils: 69 %
Platelets: 316 10*3/uL (ref 150–450)
RBC: 4.02 x10E6/uL (ref 3.77–5.28)
RDW: 11.1 % — ABNORMAL LOW (ref 11.7–15.4)
WBC: 5.4 10*3/uL (ref 3.4–10.8)

## 2024-04-13 LAB — CMP14+EGFR
ALT: 13 IU/L (ref 0–32)
AST: 21 IU/L (ref 0–40)
Albumin: 4 g/dL (ref 4.0–5.0)
Alkaline Phosphatase: 48 IU/L (ref 44–121)
BUN/Creatinine Ratio: 11 (ref 9–23)
BUN: 8 mg/dL (ref 6–20)
Bilirubin Total: 0.3 mg/dL (ref 0.0–1.2)
CO2: 22 mmol/L (ref 20–29)
Calcium: 8.9 mg/dL (ref 8.7–10.2)
Chloride: 105 mmol/L (ref 96–106)
Creatinine, Ser: 0.72 mg/dL (ref 0.57–1.00)
Globulin, Total: 2.6 g/dL (ref 1.5–4.5)
Glucose: 83 mg/dL (ref 70–99)
Potassium: 3.9 mmol/L (ref 3.5–5.2)
Sodium: 139 mmol/L (ref 134–144)
Total Protein: 6.6 g/dL (ref 6.0–8.5)
eGFR: 122 mL/min/{1.73_m2} (ref >59)

## 2024-04-13 LAB — VITAMIN B12: Vitamin B-12: 543 pg/mL (ref 232–1245)

## 2024-04-13 LAB — VITAMIN D 25 HYDROXY (VIT D DEFICIENCY, FRACTURES): Vit D, 25-Hydroxy: 28.6 ng/mL — ABNORMAL LOW (ref 30.0–100.0)

## 2024-04-13 LAB — HEMOGLOBIN A1C
Est. average glucose Bld gHb Est-mCnc: 103 mg/dL
Hgb A1c MFr Bld: 5.2 % (ref 4.8–5.6)

## 2024-04-13 LAB — LIPID PANEL
Chol/HDL Ratio: 2.8 ratio (ref 0.0–4.4)
Cholesterol, Total: 160 mg/dL (ref 100–199)
HDL: 57 mg/dL (ref >39)
LDL Chol Calc (NIH): 88 mg/dL (ref 0–99)
Triglycerides: 77 mg/dL (ref 0–149)
VLDL Cholesterol Cal: 15 mg/dL (ref 5–40)

## 2024-04-13 LAB — TSH: TSH: 1.26 u[IU]/mL (ref 0.450–4.500)

## 2024-04-25 ENCOUNTER — Other Ambulatory Visit: Payer: Self-pay | Admitting: Family

## 2024-06-07 ENCOUNTER — Other Ambulatory Visit: Payer: Self-pay

## 2024-06-07 ENCOUNTER — Telehealth: Payer: Self-pay

## 2024-06-07 MED ORDER — DOXYCYCLINE HYCLATE 100 MG PO TABS: 100.0000 mg | ORAL_TABLET | Freq: Two times a day (BID) | ORAL | 0 refills | Status: DC

## 2024-06-07 NOTE — Telephone Encounter (Signed)
 Pt LM asking for call back, she said there was an incident Friday and she needs to know if she needs testing done, will call patient back to get more information

## 2024-06-07 NOTE — Telephone Encounter (Signed)
 Spoke with patient, she had intercourse with an ex bf and when he " finished"  it was nothing but blood, he went to ED to be checked and it came back he has chlamydia, per [facility] verbal go ahead n treat her to be preventive, sending in doxy 100mg  bid

## 2024-06-17 NOTE — Assessment & Plan Note (Signed)
  Checking labs for patient today.  Will call with results when available.   Continue current meds Will adjust as needed based on these.

## 2024-06-17 NOTE — Progress Notes (Signed)
 Established Patient Office Visit  Subjective:  Patient ID: Virginia Ochoa, female    DOB: [birth year]  Age: 23 y.o. MRN: [id number]  Chief Complaint  Patient presents with   Follow-up    6 month follow up    Patient is here today for her 6 months follow up.  She has been feeling well since last appointment.   She does not have additional concerns to discuss today.  Labs are due today. She needs refills.   I have reviewed her active problem list, medication list, allergies, notes from last encounter, lab results for her appointment today.      No other concerns at this time.   Past Medical History:  Diagnosis Date   Known health problems: none 01/05/2019   Ovarian cyst     Past Surgical History:  Procedure Laterality Date   none      Social History   Socioeconomic History   Marital status: Single    Spouse name: Not on file   Number of children: Not on file   Years of education: Not on file   Highest education level: Not on file  Occupational History   Not on file  Tobacco Use   Smoking status: Never   Smokeless tobacco: Never  Vaping Use   Vaping status: Never Used  Substance and Sexual Activity   Alcohol use: Never   Drug use: Never   Sexual activity: Never    Birth control/protection: I.U.D.  Other Topics Concern   Not on file  Social History Narrative   Not on file   Social Drivers of Health   Financial Resource Strain: Not on file  Food Insecurity: Not on file  Transportation Needs: Not on file  Physical Activity: Not on file  Stress: Not on file  Social Connections: Not on file  Intimate Partner Violence: Not on file    Family History  Adopted: Yes  Family history unknown: Yes    No Known Allergies  Review of Systems  All other systems reviewed and are negative.      Objective:   BP 100/70   Pulse (!) 104   Ht 5' 6 (1.676 m)   Wt 117 lb (53.1 kg)   SpO2 99%   BMI 18.88 kg/m   Vitals:   04/12/24 1304  BP: 100/70   Pulse: (!) 104  Height: 5' 6 (1.676 m)  Weight: 117 lb (53.1 kg)  SpO2: 99%  BMI (Calculated): 18.89    Physical Exam Vitals and nursing note reviewed.  Constitutional:      Appearance: Normal appearance. She is normal weight.  HENT:     Head: Normocephalic and atraumatic.   Eyes:     Extraocular Movements: Extraocular movements intact.     Conjunctiva/sclera: Conjunctivae normal.     Pupils: Pupils are equal, round, and reactive to light.    Cardiovascular:     Rate and Rhythm: Normal rate.  Pulmonary:     Effort: Pulmonary effort is normal.   Neurological:     General: No focal deficit present.     Mental Status: She is alert and oriented to person, place, and time. Mental status is at baseline.   Psychiatric:        Mood and Affect: Mood normal.        Behavior: Behavior normal.        Thought Content: Thought content normal.        Judgment: Judgment normal.      Results for  orders placed or performed in visit on 04/12/24  Lipid panel  Result Value Ref Range   Cholesterol, Total 160 100 - 199 mg/dL   Triglycerides 77 0 - 149 mg/dL   HDL 57 >19 mg/dL   VLDL Cholesterol Cal 15 5 - 40 mg/dL   LDL Chol Calc (NIH) 88 0 - 99 mg/dL   Chol/HDL Ratio 2.8 0.0 - 4.4 ratio  VITAMIN D  25 Hydroxy (Vit-D Deficiency, Fractures)  Result Value Ref Range   Vit D, 25-Hydroxy 28.6 (L) 30.0 - 100.0 ng/mL  CMP14+EGFR  Result Value Ref Range   Glucose 83 70 - 99 mg/dL   BUN 8 6 - 20 mg/dL   Creatinine, Ser 1.47 0.57 - 1.00 mg/dL   eGFR 829 >56 OZ/HYQ/6.57   BUN/Creatinine Ratio 11 9 - 23   Sodium 139 134 - 144 mmol/L   Potassium 3.9 3.5 - 5.2 mmol/L   Chloride 105 96 - 106 mmol/L   CO2 22 20 - 29 mmol/L   Calcium 8.9 8.7 - 10.2 mg/dL   Total Protein 6.6 6.0 - 8.5 g/dL   Albumin 4.0 4.0 - 5.0 g/dL   Globulin, Total 2.6 1.5 - 4.5 g/dL   Bilirubin Total 0.3 0.0 - 1.2 mg/dL   Alkaline Phosphatase 48 44 - 121 IU/L   AST 21 0 - 40 IU/L   ALT 13 0 - 32 IU/L  TSH  Result  Value Ref Range   TSH 1.260 0.450 - 4.500 uIU/mL  Hemoglobin A1c  Result Value Ref Range   Hgb A1c MFr Bld 5.2 4.8 - 5.6 %   Est. average glucose Bld gHb Est-mCnc 103 mg/dL  Vitamin Q46  Result Value Ref Range   Vitamin B-12 543 232 - 1,245 pg/mL  CBC with Diff  Result Value Ref Range   WBC 5.4 3.4 - 10.8 x10E3/uL   RBC 4.02 3.77 - 5.28 x10E6/uL   Hemoglobin 12.7 11.1 - 15.9 g/dL   Hematocrit 96.2 95.2 - 46.6 %   MCV 91 79 - 97 fL   MCH 31.6 26.6 - 33.0 pg   MCHC 34.7 31.5 - 35.7 g/dL   RDW 84.1 (L) 32.4 - 40.1 %   Platelets 316 150 - 450 x10E3/uL   Neutrophils 69 Not Estab. %   Lymphs 22 Not Estab. %   Monocytes 7 Not Estab. %   Eos 1 Not Estab. %   Basos 1 Not Estab. %   Neutrophils Absolute 3.8 1.4 - 7.0 x10E3/uL   Lymphocytes Absolute 1.2 0.7 - 3.1 x10E3/uL   Monocytes Absolute 0.4 0.1 - 0.9 x10E3/uL   EOS (ABSOLUTE) 0.1 0.0 - 0.4 x10E3/uL   Basophils Absolute 0.0 0.0 - 0.2 x10E3/uL   Immature Granulocytes 0 Not Estab. %   Immature Grans (Abs) 0.0 0.0 - 0.1 x10E3/uL    Recent Results (from the past 2160 hours)  Lipid panel     Status: None   Collection Time: 04/12/24  1:43 PM  Result Value Ref Range   Cholesterol, Total 160 100 - 199 mg/dL   Triglycerides 77 0 - 149 mg/dL   HDL 57 >02 mg/dL   VLDL Cholesterol Cal 15 5 - 40 mg/dL   LDL Chol Calc (NIH) 88 0 - 99 mg/dL   Chol/HDL Ratio 2.8 0.0 - 4.4 ratio    Comment:  T. Chol/HDL Ratio                                             Men  Women                               1/2 Avg.Risk  3.4    3.3                                   Avg.Risk  5.0    4.4                                2X Avg.Risk  9.6    7.1                                3X Avg.Risk 23.4   11.0   VITAMIN D  25 Hydroxy (Vit-D Deficiency, Fractures)     Status: Abnormal   Collection Time: 04/12/24  1:43 PM  Result Value Ref Range   Vit D, 25-Hydroxy 28.6 (L) 30.0 - 100.0 ng/mL    Comment: Vitamin D  deficiency has been  defined by the Institute of Medicine and an Endocrine Society practice guideline as a level of serum 25-OH vitamin D  less than 20 ng/mL (1,2). The Endocrine Society went on to further define vitamin D  insufficiency as a level between 21 and 29 ng/mL (2). 1. IOM (Institute of Medicine). 2010. Dietary reference    intakes for calcium and D. [place]: The    [employer]. 2. Holick MF, Binkley [place], Bischoff-Ferrari HA, et al.    Evaluation, treatment, and prevention of vitamin D     deficiency: an Endocrine Society clinical practice    guideline. JCEM. 2011 Jul; 96(7):1911-30.   CMP14+EGFR     Status: None   Collection Time: 04/12/24  1:43 PM  Result Value Ref Range   Glucose 83 70 - 99 mg/dL   BUN 8 6 - 20 mg/dL   Creatinine, Ser 4.09 0.57 - 1.00 mg/dL   eGFR 811 >91 YN/WGN/5.62   BUN/Creatinine Ratio 11 9 - 23   Sodium 139 134 - 144 mmol/L   Potassium 3.9 3.5 - 5.2 mmol/L   Chloride 105 96 - 106 mmol/L   CO2 22 20 - 29 mmol/L   Calcium 8.9 8.7 - 10.2 mg/dL   Total Protein 6.6 6.0 - 8.5 g/dL   Albumin 4.0 4.0 - 5.0 g/dL   Globulin, Total 2.6 1.5 - 4.5 g/dL   Bilirubin Total 0.3 0.0 - 1.2 mg/dL   Alkaline Phosphatase 48 44 - 121 IU/L   AST 21 0 - 40 IU/L   ALT 13 0 - 32 IU/L  TSH     Status: None   Collection Time: 04/12/24  1:43 PM  Result Value Ref Range   TSH 1.260 0.450 - 4.500 uIU/mL  Hemoglobin A1c     Status: None   Collection Time: 04/12/24  1:43 PM  Result Value Ref Range   Hgb A1c MFr Bld 5.2 4.8 - 5.6 %    Comment:          Prediabetes: 5.7 - 6.4  Diabetes: >6.4          Glycemic control for adults with diabetes: <7.0    Est. average glucose Bld gHb Est-mCnc 103 mg/dL  Vitamin B12     Status: None   Collection Time: 04/12/24  1:43 PM  Result Value Ref Range   Vitamin B-12 543 232 - 1,245 pg/mL  CBC with Diff     Status: Abnormal   Collection Time: 04/12/24  1:43 PM  Result Value Ref Range   WBC 5.4 3.4 - 10.8 x10E3/uL   RBC 4.02  3.77 - 5.28 x10E6/uL   Hemoglobin 12.7 11.1 - 15.9 g/dL   Hematocrit 29.5 62.1 - 46.6 %   MCV 91 79 - 97 fL   MCH 31.6 26.6 - 33.0 pg   MCHC 34.7 31.5 - 35.7 g/dL   RDW 30.8 (L) 65.7 - 84.6 %   Platelets 316 150 - 450 x10E3/uL   Neutrophils 69 Not Estab. %   Lymphs 22 Not Estab. %   Monocytes 7 Not Estab. %   Eos 1 Not Estab. %   Basos 1 Not Estab. %   Neutrophils Absolute 3.8 1.4 - 7.0 x10E3/uL   Lymphocytes Absolute 1.2 0.7 - 3.1 x10E3/uL   Monocytes Absolute 0.4 0.1 - 0.9 x10E3/uL   EOS (ABSOLUTE) 0.1 0.0 - 0.4 x10E3/uL   Basophils Absolute 0.0 0.0 - 0.2 x10E3/uL   Immature Granulocytes 0 Not Estab. %   Immature Grans (Abs) 0.0 0.0 - 0.1 x10E3/uL       Assessment & Plan:   Assessment & Plan Light-headedness Mixed hyperlipidemia Essential hypertension, benign Prediabetes B12 deficiency due to diet Vitamin D  deficiency, unspecified Other fatigue  Checking labs for patient today.  Will call with results when available.   Continue current meds Will adjust as needed based on these.     Return in about 1 year (around 04/12/2025).   Total time spent: 20 minutes  Trenda Frisk, FNP  04/12/2024   This document may have been prepared by [facility] Voice Recognition software and as such may include unintentional dictation errors.

## 2024-06-28 ENCOUNTER — Other Ambulatory Visit: Payer: Self-pay

## 2024-06-28 MED ORDER — NORETHIN ACE-ETH ESTRAD-FE 1-20 MG-MCG PO TABS: 1.0000 | ORAL_TABLET | Freq: Every day | ORAL | 1 refills | Status: AC

## 2024-11-09 ENCOUNTER — Encounter: Payer: Self-pay | Admitting: Cardiology

## 2024-11-09 ENCOUNTER — Other Ambulatory Visit: Payer: Self-pay

## 2024-11-09 ENCOUNTER — Ambulatory Visit: Admitting: Cardiology

## 2024-11-09 VITALS — BP 112/72 | HR 107 | Ht 66.0 in | Wt 108.0 lb

## 2024-11-09 DIAGNOSIS — Z3201 Encounter for pregnancy test, result positive: Secondary | ICD-10-CM | POA: Insufficient documentation

## 2024-11-09 DIAGNOSIS — R000 Tachycardia, unspecified: Secondary | ICD-10-CM

## 2024-11-09 DIAGNOSIS — N926 Irregular menstruation, unspecified: Secondary | ICD-10-CM | POA: Diagnosis not present

## 2024-11-09 NOTE — Addendum Note (Signed)
 Addended by: CARIN GAUZE on: 11/09/2024 01:42 PM   Modules accepted: Orders

## 2024-11-09 NOTE — Progress Notes (Signed)
 Established Patient Office Visit  Subjective:  Patient ID: Virginia Ochoa, female    DOB: August 06, 2001  Age: 23 y.o. MRN: [id number]  Chief Complaint  Patient presents with   Follow-up    Positive Pregnancy Test, needs blood draw done    Patient in office for an acute visit. Patient states she had a positive home pregnancy test. Last menstrual period October 8th, 2025. Patient complains of feeling fatigued and out of breath. Will do blood work today to confirm pregnancy. Further recommendations pending lab results.  Heart rate elevated today. No change in medications today pending results of blood pregnancy test.     No other concerns at this time.   Past Medical History:  Diagnosis Date   Known health problems: none 01/05/2019   Ovarian cyst    Syncope 01/03/2022    Past Surgical History:  Procedure Laterality Date   none      Social History   Socioeconomic History   Marital status: Single    Spouse name: Not on file   Number of children: Not on file   Years of education: Not on file   Highest education level: Not on file  Occupational History   Not on file  Tobacco Use   Smoking status: Never   Smokeless tobacco: Never  Vaping Use   Vaping status: Never Used  Substance and Sexual Activity   Alcohol use: Never   Drug use: Never   Sexual activity: Never    Birth control/protection: I.U.D.  Other Topics Concern   Not on file  Social History Narrative   Not on file   Social Drivers of Health   Financial Resource Strain: Not on file  Food Insecurity: Not on file  Transportation Needs: Not on file  Physical Activity: Not on file  Stress: Not on file  Social Connections: Not on file  Intimate Partner Violence: Not on file    Family History  Adopted: Yes  Family history unknown: Yes    No Known Allergies  Outpatient Medications Prior to Visit  Medication Sig   norethindrone-ethinyl estradiol-FE (BLISOVI FE 1/20) 1-20 MG-MCG tablet Take 1 tablet  by mouth daily. (Patient not taking: Reported on 11/09/2024)   [DISCONTINUED] doxycycline  (VIBRA -TABS) 100 MG tablet Take 1 tablet (100 mg total) by mouth 2 (two) times daily.   [DISCONTINUED] SUMAtriptan  (IMITREX ) 50 MG tablet Take 1 tablet (50 mg total) by mouth every 2 (two) hours as needed for migraine. May repeat in 2 hours if headache persists or recurs. (Patient not taking: Reported on 04/12/2024)   No facility-administered medications prior to visit.    Review of Systems  Constitutional: Negative.   HENT: Negative.    Eyes: Negative.   Respiratory: Negative.  Negative for shortness of breath.   Cardiovascular: Negative.  Negative for chest pain.  Gastrointestinal: Negative.  Negative for abdominal pain, constipation and diarrhea.  Genitourinary: Negative.   Musculoskeletal:  Negative for joint pain and myalgias.  Skin: Negative.   Neurological: Negative.  Negative for dizziness and headaches.  Endo/Heme/Allergies: Negative.   All other systems reviewed and are negative.      Objective:   BP 112/72   Pulse (!) 107   Ht 5' 6 (1.676 m)   Wt 108 lb (49 kg)   SpO2 98%   BMI 17.43 kg/m   Vitals:   11/09/24 1312  BP: 112/72  Pulse: (!) 107  Height: 5' 6 (1.676 m)  Weight: 108 lb (49 kg)  SpO2: 98%  BMI (  Calculated): 17.44    Physical Exam Vitals and nursing note reviewed.  Constitutional:      Appearance: Normal appearance. She is normal weight.  HENT:     Head: Normocephalic and atraumatic.     Nose: Nose normal.     Mouth/Throat:     Mouth: Mucous membranes are moist.  Eyes:     Extraocular Movements: Extraocular movements intact.     Conjunctiva/sclera: Conjunctivae normal.     Pupils: Pupils are equal, round, and reactive to light.  Cardiovascular:     Rate and Rhythm: Normal rate and regular rhythm.     Pulses: Normal pulses.     Heart sounds: Normal heart sounds.  Pulmonary:     Effort: Pulmonary effort is normal.     Breath sounds: Normal breath  sounds.  Abdominal:     General: Abdomen is flat. Bowel sounds are normal.     Palpations: Abdomen is soft.  Musculoskeletal:        General: Normal range of motion.     Cervical back: Normal range of motion.  Skin:    General: Skin is warm and dry.  Neurological:     General: No focal deficit present.     Mental Status: She is alert and oriented to person, place, and time.  Psychiatric:        Mood and Affect: Mood normal.        Behavior: Behavior normal.        Thought Content: Thought content normal.        Judgment: Judgment normal.      No results found for any visits on 11/09/24.  No results found for this or any previous visit (from the past 2160 hours).    Assessment & Plan:  Blood pregnancy test today.   Problem List Items Addressed This Visit       Other   Tachycardia   Missed period - Primary   Relevant Orders   hCG, quantitative, pregnancy   Positive urine pregnancy test    Return if symptoms worsen or fail to improve.   Total time spent: 25 minutes. This time includes review of previous notes and results and patient face to face interaction during today's visit.    Jeoffrey Pollen, NP  11/09/2024   This document may have been prepared by Dragon Voice Recognition software and as such may include unintentional dictation errors.

## 2024-11-10 ENCOUNTER — Ambulatory Visit: Payer: Self-pay | Admitting: Cardiology

## 2024-11-10 LAB — BETA HCG QUANT (REF LAB): hCG Quant: 62 m[IU]/mL

## 2025-04-12 ENCOUNTER — Ambulatory Visit: Admitting: Family
